# Patient Record
Sex: Female | Born: 1970
Health system: Southern US, Community
[De-identification: ages and names within clinical notes are randomized; demographics above are authoritative.]

## PROBLEM LIST (undated history)

## (undated) DIAGNOSIS — R10819 Abdominal tenderness, unspecified site: Secondary | ICD-10-CM

## (undated) DIAGNOSIS — I10 Essential (primary) hypertension: Secondary | ICD-10-CM

## (undated) DIAGNOSIS — D509 Iron deficiency anemia, unspecified: Secondary | ICD-10-CM

## (undated) DIAGNOSIS — R079 Chest pain, unspecified: Secondary | ICD-10-CM

## (undated) DIAGNOSIS — Z87442 Personal history of urinary calculi: Secondary | ICD-10-CM

## (undated) DIAGNOSIS — N946 Dysmenorrhea, unspecified: Secondary | ICD-10-CM

## (undated) HISTORY — DX: Dysmenorrhea, unspecified: N94.6

## (undated) HISTORY — PX: TOTAL ABDOMINAL HYSTERECTOMY: SHX209

## (undated) HISTORY — DX: Personal history of urinary calculi: Z87.442

## (undated) HISTORY — DX: Iron deficiency anemia, unspecified: D50.9

## (undated) HISTORY — PX: CHOLECYSTECTOMY: SHX55

## (undated) HISTORY — DX: Chest pain, unspecified: R07.9

## (undated) HISTORY — DX: Abdominal tenderness, unspecified site: R10.819

## (undated) HISTORY — DX: Essential (primary) hypertension: I10

---

## 2008-11-19 LAB — CONVERTED CEMR LAB: Pap Smear: NORMAL

## 2010-06-10 ENCOUNTER — Ambulatory Visit: Payer: Self-pay | Admitting: Internal Medicine

## 2010-06-10 ENCOUNTER — Encounter: Payer: Self-pay | Admitting: Internal Medicine

## 2010-06-10 DIAGNOSIS — R079 Chest pain, unspecified: Secondary | ICD-10-CM

## 2010-06-10 DIAGNOSIS — N946 Dysmenorrhea, unspecified: Secondary | ICD-10-CM

## 2010-06-10 DIAGNOSIS — Z87442 Personal history of urinary calculi: Secondary | ICD-10-CM

## 2010-06-10 DIAGNOSIS — I10 Essential (primary) hypertension: Secondary | ICD-10-CM | POA: Insufficient documentation

## 2010-06-10 DIAGNOSIS — R10819 Abdominal tenderness, unspecified site: Secondary | ICD-10-CM

## 2010-06-10 DIAGNOSIS — D509 Iron deficiency anemia, unspecified: Secondary | ICD-10-CM

## 2010-06-10 HISTORY — DX: Personal history of urinary calculi: Z87.442

## 2010-06-10 HISTORY — DX: Chest pain, unspecified: R07.9

## 2010-06-10 HISTORY — DX: Iron deficiency anemia, unspecified: D50.9

## 2010-06-10 HISTORY — DX: Dysmenorrhea, unspecified: N94.6

## 2010-06-10 HISTORY — DX: Essential (primary) hypertension: I10

## 2010-06-10 HISTORY — DX: Abdominal tenderness, unspecified site: R10.819

## 2010-06-11 LAB — CONVERTED CEMR LAB
ALT: 17 units/L (ref 0–35)
AST: 18 units/L (ref 0–37)
Albumin: 3.6 g/dL (ref 3.5–5.2)
Alkaline Phosphatase: 69 units/L (ref 39–117)
Basophils Absolute: 0 10*3/uL (ref 0.0–0.1)
Basophils Relative: 0.6 % (ref 0.0–3.0)
CO2: 27 meq/L (ref 19–32)
GFR calc non Af Amer: 83.21 mL/min (ref >60)
Glucose, Bld: 90 mg/dL (ref 70–99)
HCT: 27.9 % — ABNORMAL LOW (ref 36.0–46.0)
HDL: 49.4 mg/dL (ref >39.00)
Hemoglobin: 9.2 g/dL — ABNORMAL LOW (ref 12.0–15.0)
Leukocytes, UA: NEGATIVE
Lymphs Abs: 1.7 10*3/uL (ref 0.7–4.0)
Monocytes Relative: 13.1 % — ABNORMAL HIGH (ref 3.0–12.0)
Neutro Abs: 2.7 10*3/uL (ref 1.4–7.7)
Nitrite: NEGATIVE
Potassium: 4.4 meq/L (ref 3.5–5.1)
RBC: 4.17 M/uL (ref 3.87–5.11)
RDW: 18.5 % — ABNORMAL HIGH (ref 11.5–14.6)
Saturation Ratios: 4 % — ABNORMAL LOW (ref 20.0–50.0)
Sodium: 140 meq/L (ref 135–145)
Specific Gravity, Urine: 1.02 (ref 1.000–1.030)
TSH: 0.73 microintl units/mL (ref 0.35–5.50)
Total CHOL/HDL Ratio: 3
Total Protein, Urine: NEGATIVE mg/dL
Total Protein: 6.4 g/dL (ref 6.0–8.3)
pH: 6 (ref 5.0–8.0)

## 2010-06-16 ENCOUNTER — Telehealth (INDEPENDENT_AMBULATORY_CARE_PROVIDER_SITE_OTHER): Payer: Self-pay | Admitting: *Deleted

## 2010-06-17 ENCOUNTER — Encounter: Payer: Self-pay | Admitting: Cardiology

## 2010-06-17 ENCOUNTER — Encounter (HOSPITAL_COMMUNITY)
Admission: RE | Admit: 2010-06-17 | Discharge: 2010-08-08 | Payer: Self-pay | Source: Home / Self Care | Attending: Internal Medicine | Admitting: Internal Medicine

## 2010-06-17 ENCOUNTER — Ambulatory Visit: Payer: Self-pay | Admitting: Cardiology

## 2010-06-17 ENCOUNTER — Ambulatory Visit: Payer: Self-pay

## 2010-06-19 ENCOUNTER — Encounter: Payer: Self-pay | Admitting: Internal Medicine

## 2010-07-09 ENCOUNTER — Ambulatory Visit: Payer: Self-pay | Admitting: Internal Medicine

## 2010-07-09 LAB — CONVERTED CEMR LAB
Basophils Absolute: 0.1 10*3/uL (ref 0.0–0.1)
Eosinophils Relative: 2.4 % (ref 0.0–5.0)
HCT: 31.8 % — ABNORMAL LOW (ref 36.0–46.0)
Hemoglobin: 10.7 g/dL — ABNORMAL LOW (ref 12.0–15.0)
Lymphs Abs: 2.3 10*3/uL (ref 0.7–4.0)
MCV: 73.2 fL — ABNORMAL LOW (ref 78.0–100.0)
Monocytes Absolute: 0.6 10*3/uL (ref 0.1–1.0)
Monocytes Relative: 11.2 % (ref 3.0–12.0)
Neutro Abs: 2.7 10*3/uL (ref 1.4–7.7)
Platelets: 355 10*3/uL (ref 150.0–400.0)
RDW: 26.1 % — ABNORMAL HIGH (ref 11.5–14.6)

## 2010-09-08 NOTE — Assessment & Plan Note (Signed)
Summary: Cardiology Nuclear Testing  Nuclear Med Background Indications for Stress Test: Evaluation for Ischemia    History Comments: No documented CAD  Symptoms: Chest Pressure, Chest Pressure with Exertion, Dizziness, DOE, Light-Headedness, Rapid HR  Symptoms Comments: Last episode of CP:2 weeks ago.   Nuclear Pre-Procedure Cardiac Risk Factors: Hypertension, Obesity Caffeine/Decaff Intake: None NPO After: 9:30 PM Lungs: Clear IV 0.9% NS with Angio Cath: 22g     IV Site: R Antecubital IV Started by: Bonnita Levan, RN Chest Size (in) 38     Cup Size B     Height (in): 62 Weight (lb): 226 BMI: 41.49  Nuclear Med Study 1 or 2 day study:  1 day     Stress Test Type:  Stress Reading MD:  Olga Millers, MD     Referring MD:  Oliver Barre, MD Resting Radionuclide:  Technetium 56m Tetrofosmin     Resting Radionuclide Dose:  11 mCi  Stress Radionuclide:  Technetium 30m Tetrofosmin     Stress Radionuclide Dose:  33 mCi   Stress Protocol Exercise Time (min):  5:30 min     Max HR:  160 bpm     Predicted Max HR:  180 bpm  Max Systolic BP: 194 mm Hg     Percent Max HR:  88.89 %     METS: 7.0 Rate Pressure Product:  16109    Stress Test Technologist:  [employer]a [employer], CMA-N     Nuclear Technologist:  Doyne Keel, CNMT  Rest Procedure  Myocardial perfusion imaging was performed at rest 45 minutes following the intravenous administration of Technetium 15m Tetrofosmin.  Stress Procedure  The patient exercised for 5:30.  The patient stopped due to fatigue and denied any chest pain.  There were no diagnostic ST-T wave changes.  Technetium 85m Tetrofosmin was injected at peak exercise and myocardial perfusion imaging was performed after a brief delay.  QPS Raw Data Images:  Acquisition technically good; normal left ventricular size. Stress Images:  There is decreased uptake in the anterior wall. Rest Images:  There is decreased uptake in the anterior wall. Subtraction (SDS):  No evidence of  ischemia. Transient Ischemic Dilatation:  0.94  (Normal <1.22)  Lung/Heart Ratio:  0.22  (Normal <0.45)  Quantitative Gated Spect Images QGS EDV:  96 ml QGS ESV:  34 ml QGS EF:  65 % QGS cine images:  Normal wall motion.   Overall Impression  Exercise Capacity: Fair exercise capacity. BP Response: Normal blood pressure response. Clinical Symptoms: No chest pain ECG Impression: No significant ST segment change suggestive of ischemia. Overall Impression: Normal stress nuclear study with soft tissue attenuation but no ischemia.  Appended Document: Cardiology Nuclear Testing LMOPT - labs negative, normal, or stable  - No Acute problem

## 2010-09-08 NOTE — Progress Notes (Signed)
Summary: nuc pre procedure  Phone Note Outgoing Call Call back at Home Phone [phone]   Call placed by: Cathlyn Parsons RN,  June 16, 2010 4:53 PM Call placed to: Patient Summary of Call: Left message with information on [employer] Information Sheet (see scanned document for details).      Nuclear Med Background Indications for Stress Test: Evaluation for Ischemia     Symptoms: Chest Pain, Dizziness, SOB    Nuclear Pre-Procedure Cardiac Risk Factors: Hypertension Height (in): 62

## 2010-09-08 NOTE — Assessment & Plan Note (Signed)
Summary: NEW/ [employer] /NWS  #   Vital Signs:  Patient profile:   40 year old female Height:      62 inches Weight:      230.50 pounds BMI:     42.31 O2 Sat:      98 % on Room air Temp:     97.6 degrees F oral Pulse rate:   81 / minute BP sitting:   140 / 92  (left arm) Cuff size:   large  Vitals Entered By: Zella Ball Ewing CMA Duncan Dull) (June 10, 2010 9:33 AM)  O2 Flow:  Room air  Preventive Care Screening  Pap Smear:    Date:  11/19/2008    Results:  normal   Mammogram:    Date:  08/09/2006    Results:  normal   CC: New Patient/RE Comments declines flu shot today   CC:  New Patient/RE.  History of Present Illness: here for wellness and f/u ; has some achy type diffuse back pain recurring chronic for several yrs worse to bend and get up from chair, no bowel or bladder change, fever, wt loss, or LE pain/weak/numb.  Pt denies new neuro symptoms such as headache, facial or extremity weakness  No fever, wt loss, night sweats, loss of appetite or other constitutional symptoms  Denies worsening depressive symptoms, suicidal ideation, or panic.  Pt denies polydipsia, polyuria,  Overall good compliance with meds, trying to follow low chol diet, wt stable, little excercise however, and ran out of BP med a few wks ago. Pt states good ability with ADL's, low fall risk, home safety reviewed and adequate, no significant change in hearing or vision, trying to follow lower chol diet, and occasionally active only with regular excercise.    also mentions 6 mo recurrent lower to mid SSCP assoc with SOB and dizziness, dull, worse with excercise, but no n/v, sweats, or palps, syncope, better with rest.   Preventive Screening-Counseling & Management  Alcohol-Tobacco     Smoking Status: never      Drug Use:  no.    Problems Prior to Update: 1)  Menorrhalgia  (ICD-625.3) 2)  Chest Pain  (ICD-786.50) 3)  Abdominal Tenderness  (ICD-789.60) 4)  Preventive Health Care  (ICD-V70.0) 5)  Anemia-iron  Deficiency  (ICD-280.9) 6)  Nephrolithiasis, Hx of  (ICD-V13.01) 7)  Hypertension  (ICD-401.9)  Medications Prior to Update: 1)  None  Current Medications (verified): 1)  Lotrel 10-20 Mg Caps (Amlodipine Besy-Benazepril Hcl) .Marland Kitchen.. 1 By Mouth Once Daily 2)  Slow Fe 160 (50 Fe) Mg Cr-Tabs (Ferrous Sulfate Dried) .Marland Kitchen.. 1po Once Daily 3)  Aspir-Low 81 Mg Tbec (Aspirin) .Marland Kitchen.. 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 parent with stroke, HTN grandparent with DJD, stroke, HTN, DM other family with HTN  Social History: Reviewed history and no changes required. 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[employer]); parttime (health ins  through husband - mixer for [employer]) Never Smoked Alcohol use-yes - social Drug use-no Smoking Status:  never Drug Use:  no  Review of Systems  The patient denies anorexia, fever, vision loss, decreased hearing, hoarseness, syncope, dyspnea on exertion, peripheral edema, prolonged cough, headaches, hemoptysis, abdominal pain, melena, hematochezia, severe indigestion/heartburn, hematuria, muscle weakness, suspicious skin lesions, transient blindness, difficulty walking, depression, unusual weight change, abnormal bleeding, enlarged lymph nodes, and angioedema.          all otherwise negative per pt -    Physical Exam  General:  alert and overweight-appearing.   Head:  normocephalic and atraumatic.   Eyes:  vision grossly intact, pupils equal, and pupils round.   Ears:  R ear normal and L ear normal.   Nose:  no external deformity and no nasal discharge.   Mouth:  no gingival abnormalities and pharynx pink and moist.   Neck:  supple and no masses.   Lungs:  normal respiratory effort and normal breath sounds.   Heart:  normal rate and regular rhythm.   Abdomen:  soft and normal bowel sounds.  and nontender except for mild lower mid abd tender without guarding or rebound Msk:  no joint tenderness and no joint swelling.   Extremities:  no edema, no erythema  Neurologic:  strength normal in all extremities and gait normal.   Skin:  color normal and no rashes.   Psych:  not depressed appearing and slightly anxious.     Impression & Recommendations:  Problem # 1:  Preventive Health Care (ICD-V70.0) Overall doing well, age appropriate education and counseling updated, referral for preventive services and immunizations addressed, dietary counseling and smoking status adressed , most recent labs reviewed, ecg reviewed I have personally reviewed and have noted 1.The patient's medical and social history 2.Their use of alcohol, tobacco or illicit drugs 3.Their current medications and supplements 4. Functional ability including ADL's, fall risk, home safety risk, hearing & visual impairment  5.Diet and physical activities 6.Evidence for depression or mood disorders The patients weight, height, BMI  have been recorded in the chart I have made referrals, counseling and provided education to the patient based review of the above  Orders: EKG w/ Interpretation (93000) TLB-BMP (Basic Metabolic Panel-BMET) (80048-METABOL) TLB-CBC Platelet - w/Differential (85025-CBCD) TLB-Hepatic/Liver Function Pnl (80076-HEPATIC) TLB-TSH (Thyroid Stimulating Hormone)  (84443-TSH) TLB-Lipid Panel (80061-LIPID)  Problem # 2:  HYPERTENSION (ICD-401.9)  Her updated medication list for this problem includes:    Lotrel 10-20 Mg Caps (Amlodipine besy-benazepril hcl) .Marland Kitchen... 1 by mouth once daily uncontrolled, to re-start med. f/u BP at home and next visit  BP today: 140/92  Problem # 3:  ANEMIA-IRON DEFICIENCY (ICD-280.9)  Her updated medication list for this problem includes:    Slow Fe 160 (50 Fe) Mg Cr-tabs (Ferrous sulfate dried) .Marland Kitchen... 1po once daily  Orders: TLB-IBC Pnl (Iron/FE;Transferrin) (83550-IBC) to cont daily iron supp, check iron level, to f/u with GYN for menorraghia as well   Problem # 4:  ABDOMINAL TENDERNESS (ICD-789.60) low mid abd  - incidently noted this - for urine study as well  Orders: TLB-Udip w/ Micro (81001-URINE)  Problem # 5:  CHEST PAIN (ICD-786.50)  very young for CV dz, but has hx of HTN , ? elev chol and strong FH  of CV problem, with hx suspicious for cardiac - for stress test; ecg reveiwed today, also for cxr   Complete Medication List: 1)  Lotrel 10-20 Mg Caps (Amlodipine besy-benazepril hcl) .Marland Kitchen.. 1 by mouth  once daily 2)  Slow Fe 160 (50 Fe) Mg Cr-tabs (Ferrous sulfate dried) .Marland Kitchen.. 1po once daily 3)  Aspir-low 81 Mg Tbec (Aspirin) .Marland Kitchen.. 1po once daily  Other Orders: Tdap => 35yrs IM (10272) Admin 1st Vaccine (53664) Cardiolite (Cardiolite) T-2 View CXR, Same Day (71020.5TC)  Patient Instructions: 1)  please call for your yearly mammogram (consider calling [facility] on [address], or [facility] on [address]), as well as the Pap smear with GYN 2)  Please go to the Lab in the basement for your blood and/or urine tests today 3)  Please call the number on the [facility] for results of your testing 4)  You are given the refill today 5)  Take an Aspirin every day - 81 mg per day - COATED only (OTC) if you can see the GYN to make sure no worseing bleeding 6)  you had the tetanus shot today 7)  You will be contacted  about the referral(s) to: stress test 8)  Please schedule a follow-up appointment in 1 yr , or sooner if needed 9)  Check your Blood Pressure regularly. If it is above 140/90: you should make an appointment sooner Prescriptions: LOTREL 10-20 MG CAPS (AMLODIPINE BESY-BENAZEPRIL HCL) 1 by mouth once daily  #90 x 3   Entered and Authorized by:   Corwin Levins MD   Signed by:   Corwin Levins MD on 06/10/2010   Method used:   Print then Give to Patient   RxID:   [id number]    Orders Added: 1)  EKG w/ Interpretation [93000] 2)  Tdap => 38yrs IM [90715] 3)  Admin 1st Vaccine [90471] 4)  Cardiolite [Cardiolite] 5)  T-2 View CXR, Same Day [71020.5TC] 6)  TLB-BMP (Basic Metabolic Panel-BMET) [80048-METABOL] 7)  TLB-CBC Platelet - w/Differential [85025-CBCD] 8)  TLB-Hepatic/Liver Function Pnl [80076-HEPATIC] 9)  TLB-TSH (Thyroid Stimulating Hormone) [84443-TSH] 10)  TLB-Lipid Panel [80061-LIPID] 11)  TLB-Udip w/ Micro [81001-URINE] 12)  TLB-IBC Pnl (Iron/FE;Transferrin) [83550-IBC] 13)  New Patient 40-64 years [99386]   Immunizations Administered:  Tetanus Vaccine:    Vaccine Type: Tdap    Site: left deltoid    Mfr: GlaxoSmithKline    Dose: 0.5 ml    Route: IM    Given by: Zella Ball Ewing CMA (AAMA)    Exp. Date: 05/28/2012    Lot #: [id number]    VIS given: 06/26/08 version given June 10, 2010.   Immunizations Administered:  Tetanus Vaccine:    Vaccine Type: Tdap    Site: left deltoid    Mfr: GlaxoSmithKline    Dose: 0.5 ml    Route: IM    Given by: Zella Ball Ewing CMA (AAMA)    Exp. Date: 05/28/2012    Lot #: [id number]    VIS given: 06/26/08 version given June 10, 2010.

## 2010-09-08 NOTE — Miscellaneous (Signed)
Summary: Orders Update  Clinical Lists Changes  Problems: Added new problem of MENORRHALGIA (ICD-625.3) Orders: Added new Referral order of Gynecologic Referral (Gyn) - Signed

## 2010-09-08 NOTE — Consult Note (Signed)
[facility]   Imported By: Lester Dwight 07/03/2010 10:44:32  _____________________________________________________________________  External Attachment:    Type:   Image     Comment:   External Document

## 2010-09-28 ENCOUNTER — Encounter (HOSPITAL_COMMUNITY)
Admission: RE | Admit: 2010-09-28 | Discharge: 2010-09-28 | Disposition: A | Discharge status: Home / Self Care | Payer: Managed Care, Other (non HMO) | Source: Ambulatory Visit | Attending: Obstetrics & Gynecology | Admitting: Obstetrics & Gynecology

## 2010-09-28 DIAGNOSIS — Z01812 Encounter for preprocedural laboratory examination: Secondary | ICD-10-CM | POA: Insufficient documentation

## 2010-09-28 LAB — CBC
HCT: 35 % — ABNORMAL LOW (ref 36.0–46.0)
MCHC: 31.7 g/dL (ref 30.0–36.0)
MCV: 79 fL (ref 78.0–100.0)
Platelets: 240 10*3/uL (ref 150–400)
RDW: 15.3 % (ref 11.5–15.5)
WBC: 5.4 10*3/uL (ref 4.0–10.5)

## 2010-10-04 ENCOUNTER — Other Ambulatory Visit: Payer: Self-pay | Admitting: Obstetrics & Gynecology

## 2010-10-05 ENCOUNTER — Ambulatory Visit (HOSPITAL_COMMUNITY)
Admission: RE | Admit: 2010-10-05 | Discharge: 2010-10-06 | Disposition: A | Discharge status: Home / Self Care | Payer: Managed Care, Other (non HMO) | Source: Ambulatory Visit | Attending: Obstetrics & Gynecology | Admitting: Obstetrics & Gynecology

## 2010-10-05 DIAGNOSIS — N8000 Endometriosis of the uterus, unspecified: Secondary | ICD-10-CM | POA: Insufficient documentation

## 2010-10-05 DIAGNOSIS — D250 Submucous leiomyoma of uterus: Secondary | ICD-10-CM | POA: Insufficient documentation

## 2010-10-05 DIAGNOSIS — N920 Excessive and frequent menstruation with regular cycle: Secondary | ICD-10-CM | POA: Insufficient documentation

## 2010-10-05 DIAGNOSIS — I10 Essential (primary) hypertension: Secondary | ICD-10-CM | POA: Insufficient documentation

## 2010-10-05 DIAGNOSIS — D252 Subserosal leiomyoma of uterus: Secondary | ICD-10-CM | POA: Insufficient documentation

## 2010-10-05 DIAGNOSIS — D251 Intramural leiomyoma of uterus: Secondary | ICD-10-CM | POA: Insufficient documentation

## 2010-10-06 LAB — CBC
HCT: 34.4 % — ABNORMAL LOW (ref 36.0–46.0)
MCV: 79.1 fL (ref 78.0–100.0)
Platelets: 272 10*3/uL (ref 150–400)
RBC: 4.35 MIL/uL (ref 3.87–5.11)
WBC: 11.2 10*3/uL — ABNORMAL HIGH (ref 4.0–10.5)

## 2010-10-27 NOTE — H&P (Signed)
[name], [name]                ACCOUNT NO.:  [id number]  MEDICAL RECORD NO.:  [id number]           PATIENT TYPE:  LOCATION:                                 FACILITY:  PHYSICIAN:  [name], M.D.   DATE OF BIRTH:  Dec 15, 1970  DATE OF ADMISSION: DATE OF DISCHARGE:                             HISTORY & PHYSICAL   ADMISSION DIAGNOSES: 1. Dysfunctional uterine bleeding. 2. Menorrhagia. 3. Probable adenomyosis by ultrasound findings. 4. Request for definitive surgical intervention.  The patient is a 40 year old black married female, gravida 1, para 1, delivered by cesarean section, who has had protracted history of dysfunctional uterine bleeding who is a regular patient of Dr. Teodora Oneal.  His workup included pelvic ultrasound and endometrial biopsy with findings as noted above.  The histology on the biopsy material was benign. The patient was referred to me for consultation regarding probable surgery.  Other options discussed included an intrauterine device and an endometrial ablation.  The ablation is known to have a higher failure rate in those with adenomyosis.  The patient prefers not to have an intrauterine device.  She has requested definitive surgical intervention, is admitted now for laparoscopically assisted vaginal hysterectomy.  Her current review of systems is otherwise negative.  She has no cardiopulmonary, GI, or GU complaints.  PAST MEDICAL HISTORY:  The patient is known to be hypertensive.  She takes Lotrel 10/20 on a daily basis.  Her regular medical physician is Dr. Oliver Oneal.  Her only other medication is Slow Iron twice daily, and she has taken Provera intermittently as a way of controlling her bleeding.  She does not smoke cigarettes or use alcohol.  She has no known allergies to medications.  The only other surgical procedure was a cholecystectomy in 2008.  FAMILY HISTORY:  Noncontributory.  PHYSICAL EXAMINATION:  HEENT:  Grossly within  normal limits. VITAL SIGNS:  Blood pressure in my office was 146/88.  The patient's weight is 232 pounds, height 5 feet 3/4 inches. NECK:  Her thyroid gland is not palpably enlarged to my examination. CHEST:  Clear to auscultation throughout. HEART:  Normal sinus rhythm without murmurs, rubs, or gallops. ABDOMEN:  Obese but soft and nontender without appreciable organomegaly or palpable masses.  Scars are present consistent with her previous surgery. PELVIC:  The external genitalia, vagina, and cervix are normal.  The bimanual is compromised by body habitus, but the uterus is thought to be slightly enlarged by ultrasound.  No ovarian masses were noted. EXTREMITIES:  Without cyanosis, clubbing, or edema.  ASSESSMENT:  Menometrorrhagia, previously benign endometrial biopsy in November 2011, persistent dysfunctional bleeding even with cyclic hormonal withdrawal.  PLAN:  Laparoscopically-assisted vaginal hysterectomy, bilateral salpingo-oophorectomy only in the event of ovarian abnormalities or evidence of ovarian disease.  The patient has reviewed ACOG materials regarding this procedure which includes risk of injury to other organs, hemorrhage intraoperatively and postoperatively, intraoperative infection, and deep vein thrombosis.  Appropriate prophylactic measures have also been discussed including serial compression devices to the lower extremities and IV antibiotics and early ambulation.  The patient is prepared to proceed with this procedure and  verbally accepts the risk associated with it.     [name], M.D.     WRN/MEDQ  D:  10/03/2010  T:  10/04/2010  Job:  [id number]  Electronically Signed by [name] M.D. on 10/27/2010 07:44:44 AM

## 2010-10-27 NOTE — Discharge Summary (Signed)
  NAMECUMA, [name]                ACCOUNT NO.:  [id number]  MEDICAL RECORD NO.:  [id number]           PATIENT TYPE:  O  LOCATION:  9302                          FACILITY:  [facility]  PHYSICIAN:  Freddy Finner, M.D.   DATE OF BIRTH:  1971-07-26  DATE OF ADMISSION:  10/05/2010 DATE OF DISCHARGE:  10/06/2010                              DISCHARGE SUMMARY   DISCHARGE DIAGNOSES:  Uterine adenomyosis is yet unconfirmed histologically, clinical diagnosis of dysfunctional uterine bleeding, menorrhagia.  OPERATIVE PROCEDURE:  Laparoscopic-assisted vaginal hysterectomy.  Intraoperative and postoperative complications none.  DISPOSITION:  The patient is in satisfactory improved condition on the morning of the first postoperative day.  She is ambulating without assistance.  She is having adequate bowel and bladder function.  She is tolerating liquids orally, she is instructed to call for fever, for heavy bleeding.  She is to avoid heavy lifting.  She is to avoid vaginal entry.  She is to return to the office in 2 weeks for postoperative followup.  She is to take a regular diet.  She is to resume her preoperative medications.  Details of the present history and physical exam, past history, review of systems were all recorded in the admission note.  Briefly, the patient has had a persistent dysfunctional uterine bleeding and has ultrasound findings consistent with adenomyosis.  Other options were discussed and the patient was elected to proceed with definitive surgical intervention.  She is a regular patient of Dr. Chevis Pretty, who kindly referred her to me.  Her physical exam findings on admission are remarkable for her obesity which is moderate and for enlargement of the uterus which is tender  Laboratory data during this admission includes postoperative CBC with a hemoglobin of 11.1 and a white count of 11.2.  Her admission labs included a negative MRSA screen, normal CBC except for hemoglobin  11.1 and normal, looks like she has all the labs that was done.  HOSPITAL COURSE:  The patient was admitted on the morning of surgery. She was treated perioperatively with an IV antibiotic and serial compression devices for lower extremities which were maintained for 24 hours postop.  Her surgical procedure was accomplished without difficulty or significant bleeding.  By the morning of the first postoperative day, her condition was considered to be good.  She remained afebrile throughout from the disposition as noted above.     Freddy Finner, M.D.    WRN/MEDQ  D:  10/06/2010  T:  10/06/2010  Job:  [id number]  Electronically Signed by Lacretia Nicks. Souleymane Saiki M.D. on 10/27/2010 07:44:41 AM

## 2010-10-27 NOTE — Op Note (Signed)
[name], [name]                ACCOUNT NO.:  [id number]  MEDICAL RECORD NO.:  [id number]           PATIENT TYPE:  O  LOCATION:  9302                          FACILITY:  [facility]  PHYSICIAN:  Freddy Finner, M.D.   DATE OF BIRTH:  22-Jul-1971  DATE OF PROCEDURE:  10/05/2010 DATE OF DISCHARGE:                              OPERATIVE REPORT   PREOPERATIVE DIAGNOSIS:  Persistent dysfunctional uterine bleeding. Ultrasound findings consistent with adenomyosis.  The patient requests for definitive surgical intervention with suspected __________.  OPERATIVE PROCEDURE:  Laparoscopic-assisted vaginal hysterectomy.  SURGEON:  Freddy Finner, MD  ASSISTANT:  Rana Snare.  ANESTHESIA:  General endotracheal.  ESTIMATED INTRAOPERATIVE BLOOD LOSS:  200 mL.  INTRAOPERATIVE COMPLICATIONS:  None.  The patient is a 40 year old with a long history of dysfunctional uterine bleeding, unrelieved by __________ therapy and an ultrasound findings in the office consistent with adenomyosis.  Other options have been discussed with her for treatment including an intrauterine device and an endometrial ablation, which is thought to have a higher failure rate with adenomyosis.  The patient has requested definitive surgery or resolve the issue and has requested hysterectomy and is admitted at this time for that purpose.  She was admitted on the morning of surgery.  She had revealed the ACOG materials including potential risks of the procedure __________ prophylaxis measures were discussed with her and she is now admitted to proceed with surgery.  She was admitted on the morning of surgery, placed on PAS hose, given a gram of Ancef IV preoperatively, brought to the operating room, there placed under adequate general endotracheal anesthesia.  She was placed in dorsal lithotomy position using the [employer] system.  Betadine prep of abdomen, perineum and vagina was carried out in the usual fashion.  Bladder was  evacuated using sterile technique.  Cervix was visualized with a bivalve speculum and Hulka tenaculum attached. Sterile drapes were applied.  Two small incisions were made, one at the umbilicus, one just above the symphysis.  Through the upper incision, disposable Veress needle was introduced without difficulty.  A pneumoperitoneum of approximately 1.8 L was allowed to accumulate with 11-mm blade, disposable trocar was then introduced through the umbilical incision while elevating the anterior abdominal wall manually.  Direct inspection revealed adequate placement of the instrument with no evidence of injury on entry.  Pneumoperitoneum was allowed to accumulate with carbon dioxide gas.  Under direct visualization, a second 5-mm probe was placed.  __________ irrigation system were used.  The upper abdomen exploration was compromised by adhesions consistent with previous cholecystectomy.  The appendix was not visualized.  Pelvic organs were visualized.  The uterus was symmetrically enlarged.  The tubes and ovaries were found to be normal and at her request will be left.  Using the [employer] device through the operating channel, the laparoscope and a blunt probe to the lower trocar sleeve for traction, the utero-ovarian, fallopian tube, crown ligament and upper broad ligament were progressively sealed and divided.  The dissection was carried down to approximately the level of the uterine arteries.  There were some scarring along the round ligament  on the right side, which did not create a significant problem.  The left side was treated essentially identically, although no additional adhesions were noted.  Attention was then turned to the vaginal portion of the procedure.  Gas was allowed to escape from the abdomen.  The instruments removed, trocars left in place.  Posterior weighted vaginal retractor was placed.  __________ retractors were used for exposure of the cervix.  Hulka tenaculum  was removed and replaced with a Jacobs tenaculum.  With an Allis, to tent the mucosa posterior to the cervix.  The cul-de-sac was entered with Mayo scissors.  Cervix was circumscribed with a scalpel to release the mucosa.  Uterosacral pedicles on each side were sealed and with the LigaSure system and sharply divided.  Bladder pillars were similarly treated.  Very careful dissection was used to dissect the bladder off the cervix and lower uterine segment because of the history of cesarean delivery.  Please note immediately prior to this dissection, the patient was given IV indigo carmine to allow immediate identification if any bladder injury occurred and none did.  The anterior peritoneum was entered.  The LigaSure device was used to seal and divide the uterine artery pedicles and the pedicle on either side above the uterine arteries.  This allowed delivery of the uterus through the introitus using a Jacobs tenaculum and a thyroid clamp.  Uterus weighed 240 grams. Angles of the vagina were grasped on either side.  Moist tape was used to pack the intestinal contents out of the pelvis.  __________ posterior weighted retractor was then used.  Angles of the vagina were anchored to uterosacrals with mattress sutures of 0 Monocryl.  The uterosacrals were plicated and posterior peritoneum closed with an interrupted 0 Monocryl. Cuff was closed vertically with figure-of-eights of 0 Monocryl.  Foley catheter was placed, immediate spillage of blue dye was noted. Reinspection laparoscopically was then carried out using __________ irrigation system.  Photographs were made throughout the procedure and were retained in the office record.  Small amount of bloody material was aspirated from the abdomen along with the irrigating solution used to cleanse and inspect the pedicles.  Hemostasis was complete.  All pack, needle and instrument counts were correct.  Gas was allowed to escape from the abdomen.   Instruments removed.  The incisions were anesthetized with 0.25% plain Marcaine.  Steri-Strips were applied to the lower incision.  The upper incision was closed with subcuticular Dexon sutures and anesthetized with 0.25% Marcaine and a sterile dressing applied. The patient was awakened, taken to recovery in good condition.     Freddy Finner, M.D.     WRN/MEDQ  D:  10/05/2010  T:  10/06/2010  Job:  [id number]  Electronically Signed by W. NEAL M.D. on 10/27/2010 07:44:47 AM

## 2010-11-12 ENCOUNTER — Ambulatory Visit (INDEPENDENT_AMBULATORY_CARE_PROVIDER_SITE_OTHER): Payer: Managed Care, Other (non HMO) | Admitting: Internal Medicine

## 2010-11-12 ENCOUNTER — Encounter: Payer: Self-pay | Admitting: Internal Medicine

## 2010-11-12 DIAGNOSIS — Z0000 Encounter for general adult medical examination without abnormal findings: Secondary | ICD-10-CM

## 2010-11-12 DIAGNOSIS — I10 Essential (primary) hypertension: Secondary | ICD-10-CM

## 2010-11-12 DIAGNOSIS — Z0001 Encounter for general adult medical examination with abnormal findings: Secondary | ICD-10-CM | POA: Insufficient documentation

## 2010-11-12 NOTE — Patient Instructions (Signed)
Continue all other medications as before Please return in Nov 2012 with Lab testing done 3-5 days before

## 2010-11-12 NOTE — Progress Notes (Signed)
  Subjective:    Patient ID: [name], female    DOB: [birth year], 40 y.o.   MRN: [id number]  HPI  Here to f/u at the urging of GYN to f/u post TAH;  Pt states overall doing well, without specific complaint.  Pt denies chest pain, increased sob or doe, wheezing, orthopnea, PND, increased LE swelling, palpitations, dizziness or syncope. Pt denies new neurological symptoms such as new headache, or facial or extremity weakness or numbness  Pt denies polydipsia, polyuria  Pt states overall good compliance with meds, trying to follow lower cholesterol diet, wt overall stable but little exercise however.    Pt denies fever, wt loss, night sweats, loss of appetite, or other constitutional symptoms Denies worsening depressive symptoms, suicidal ideation, or panic.  No other new complaints.  BP at home usually better controlled than today - has not taken BP med today  Past Medical History  Diagnosis Date  . ANEMIA-IRON DEFICIENCY 06/10/2010  . HYPERTENSION 06/10/2010  . MENORRHALGIA 06/10/2010  . CHEST PAIN 06/10/2010  . ABDOMINAL TENDERNESS 06/10/2010  . NEPHROLITHIASIS, HX OF 06/10/2010   Past Surgical History  Procedure Date  . Cholecystectomy   . Total abdominal hysterectomy     reports that she has never smoked. She does not have any smokeless tobacco history on file. She reports that she drinks alcohol. She reports that she does not use illicit drugs. family history includes Diabetes in her other; Hypertension in her others; and Stroke in her others. No Known Allergies Current Outpatient Prescriptions on File Prior to Visit  Medication Sig Dispense Refill  . amLODipine-benazepril (LOTREL) 10-20 MG per capsule Take 1 capsule by mouth daily.        Marland Kitchen aspirin 81 MG EC tablet Take 81 mg by mouth daily.        . ferrous sulfate (SLOW FE) 160 (50 FE) MG TBCR Take by mouth daily.         Review of Systems All otherwise neg per pt     Objective:   Physical ExamBP 132/90  Pulse 78  Temp(Src)  98.1 F (36.7 C) (Oral)  Ht 5\' 1"  (1.549 m)  Wt 232 lb 6 oz (105.405 kg)  BMI 43.91 kg/m2  SpO2 98% Physical Exam  VS noted Constitutional: Pt appears well-developed and well-nourished.  HENT: Head: Normocephalic.  Right Ear: External ear normal.  Left Ear: External ear normal.  Eyes: Conjunctivae and EOM are normal. Pupils are equal, round, and reactive to light.  Neck: Normal range of motion. Neck supple.  Cardiovascular: Normal rate and regular rhythm.   Pulmonary/Chest: Effort normal and breath sounds normal.  Abd:  Soft, NT, non-distended, + BS Neurological: Pt is alert. No cranial nerve deficit.  Skin: Skin is warm. No erythema.  Psychiatric: Pt behavior is normal. Thought content normal.          Assessment & Plan:

## 2010-11-12 NOTE — Assessment & Plan Note (Signed)
stable overall by hx and exam, most recent lab reviewed with pt, and pt to continue medical treatment as before 

## 2011-03-10 ENCOUNTER — Encounter: Payer: Self-pay | Admitting: Internal Medicine

## 2011-03-10 ENCOUNTER — Ambulatory Visit (INDEPENDENT_AMBULATORY_CARE_PROVIDER_SITE_OTHER): Payer: Managed Care, Other (non HMO) | Admitting: Internal Medicine

## 2011-03-10 VITALS — BP 132/78 | HR 69 | Temp 97.2°F | Ht 62.0 in | Wt 236.5 lb

## 2011-03-10 DIAGNOSIS — I10 Essential (primary) hypertension: Secondary | ICD-10-CM

## 2011-03-10 DIAGNOSIS — Z0000 Encounter for general adult medical examination without abnormal findings: Secondary | ICD-10-CM

## 2011-03-10 NOTE — Progress Notes (Signed)
  Subjective:    Patient ID: [name], female    DOB: [birth year], 40 y.o.   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  Pt denies new neurological symptoms such as new headache, or facial or extremity weakness or numbness   Pt denies polydipsia, polyuria, or low sugar symptoms such as weakness or confusion improved with po intake.  Pt states overall good compliance with meds, trying to follow lower cholesterol diet, wt overall stable but little exercise however. Past Medical History  Diagnosis Date  . ANEMIA-IRON DEFICIENCY 06/10/2010  . HYPERTENSION 06/10/2010  . MENORRHALGIA 06/10/2010  . CHEST PAIN 06/10/2010  . ABDOMINAL TENDERNESS 06/10/2010  . NEPHROLITHIASIS, HX OF 06/10/2010   Past Surgical History  Procedure Date  . Cholecystectomy   . Total abdominal hysterectomy     reports that she has never smoked. She does not have any smokeless tobacco history on file. She reports that she drinks alcohol. She reports that she does not use illicit drugs. family history includes Diabetes in her other; Hypertension in her others; and Stroke in her others. No Known Allergies Current Outpatient Prescriptions on File Prior to Visit  Medication Sig Dispense Refill  . amLODipine-benazepril (LOTREL) 10-20 MG per capsule Take 1 capsule by mouth daily.        Marland Kitchen aspirin 81 MG EC tablet Take 81 mg by mouth daily.        . ferrous sulfate (SLOW FE) 160 (50 FE) MG TBCR Take by mouth daily.         Review of Systems Review of Systems  Constitutional: Negative for diaphoresis and unexpected weight change.  HENT: Negative for drooling and tinnitus.   Eyes: Negative for photophobia and visual disturbance.  Respiratory: Negative for choking and stridor.      Objective:   Physical Exam BP 132/78  Pulse 69   Temp(Src) 97.2 F (36.2 C) (Oral)  Ht 5\' 2"  (1.575 m)  Wt 236 lb 8 oz (107.276 kg)  BMI 43.26 kg/m2  SpO2 99% Physical Exam  VS noted Constitutional: Pt appears well-developed and well-nourished.  HENT: Head: Normocephalic.  Right Ear: External ear normal.  Left Ear: External ear normal.  Eyes: Conjunctivae and EOM are normal. Pupils are equal, round, and reactive to light.  Neck: Normal range of motion. Neck supple.  Cardiovascular: Normal rate and regular rhythm.   Pulmonary/Chest: Effort normal and breath sounds normal.  Abd:  Soft, NT, non-distended, + BS Neurological: Pt is alert. No cranial nerve deficit.  Skin: Skin is warm. No erythema.  Psychiatric: Pt behavior is normal. Thought content normal.        Assessment & Plan:

## 2011-03-10 NOTE — Patient Instructions (Addendum)
Your Blood Pressure was found to be controlled today, and refill done to the pharmacy You are given the work note today Continue all other medications as before Please return in April 2013 for your yearly visit, or sooner if needed, with Lab testing done 3-5 days before (ok to cancel Nov 2012 appt)

## 2011-04-05 ENCOUNTER — Ambulatory Visit (INDEPENDENT_AMBULATORY_CARE_PROVIDER_SITE_OTHER): Payer: Managed Care, Other (non HMO)

## 2011-04-05 VITALS — BP 132/82

## 2011-04-05 DIAGNOSIS — I10 Essential (primary) hypertension: Secondary | ICD-10-CM

## 2011-04-07 ENCOUNTER — Ambulatory Visit (INDEPENDENT_AMBULATORY_CARE_PROVIDER_SITE_OTHER): Payer: Managed Care, Other (non HMO) | Admitting: *Deleted

## 2011-04-07 VITALS — BP 138/88

## 2011-04-07 DIAGNOSIS — I10 Essential (primary) hypertension: Secondary | ICD-10-CM

## 2011-04-09 ENCOUNTER — Ambulatory Visit (INDEPENDENT_AMBULATORY_CARE_PROVIDER_SITE_OTHER): Payer: Managed Care, Other (non HMO)

## 2011-04-09 DIAGNOSIS — I10 Essential (primary) hypertension: Secondary | ICD-10-CM

## 2011-04-22 NOTE — Progress Notes (Signed)
  Subjective:    Patient ID: [name], female    DOB: 1971/02/18, 40 y.o.   MRN: [id number]  HPI    Review of Systems     Objective:   Physical Exam        Assessment & Plan:

## 2011-04-22 NOTE — Assessment & Plan Note (Signed)
stable overall by hx and exam,and pt to continue medical treatment as before, VS today reviewed and agree

## 2011-06-17 ENCOUNTER — Telehealth: Payer: Self-pay

## 2011-06-17 NOTE — Telephone Encounter (Signed)
Ok for note  See encounters tab (under chart review)  -  The Preventative Encounter is emr talk for physical

## 2011-06-17 NOTE — Telephone Encounter (Signed)
Patient needs a letter stating she has been seen (physical ?)  in the past year for insurance reasons, please advise as patient has been seen several times but do not see an actual CPX appointment.

## 2011-06-18 NOTE — Telephone Encounter (Signed)
Completed requested letter, called and informed the patient to pickup at front desk at her convenience.

## 2011-06-21 ENCOUNTER — Ambulatory Visit: Payer: Managed Care, Other (non HMO) | Admitting: Internal Medicine

## 2011-06-29 ENCOUNTER — Other Ambulatory Visit: Payer: Self-pay | Admitting: Internal Medicine

## 2011-10-09 ENCOUNTER — Ambulatory Visit (INDEPENDENT_AMBULATORY_CARE_PROVIDER_SITE_OTHER): Payer: Managed Care, Other (non HMO) | Admitting: Family Medicine

## 2011-10-09 ENCOUNTER — Encounter: Payer: Self-pay | Admitting: Family Medicine

## 2011-10-09 VITALS — BP 160/98 | HR 78 | Temp 97.6°F | Wt 238.0 lb

## 2011-10-09 DIAGNOSIS — J019 Acute sinusitis, unspecified: Secondary | ICD-10-CM

## 2011-10-09 MED ORDER — FLUTICASONE PROPIONATE 50 MCG/ACT NA SUSP: 2.0000 | Freq: Every day | NASAL | Status: DC

## 2011-10-09 MED ORDER — AMOXICILLIN-POT CLAVULANATE 875-125 MG PO TABS: 1.0000 | ORAL_TABLET | Freq: Two times a day (BID) | ORAL | Status: AC

## 2011-10-09 NOTE — Progress Notes (Signed)
  Subjective:    Patient ID: [name], female    DOB: 06/06/1971, 41 y.o.   MRN: [id number]  HPI CC: cold sxs  BP elevated today but didn't take lotrel this morning.  Will take when she goes home.  Cold sxs for last week.  ST initially and fever none recently, RN, watery eyes, stuffy nose, coughing.  Cough keeping her up at night, some production of yellow mucous.  Mild PNDrainage.  So far has tried nyquil but not helping.  no fevers/chills, ear pain or tooth pain, abd pain, n/v, rashes.  No HA.  + sick contacts at work.  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  No smoker at home.  No h/o asthma.  Review of Systems Per HPI    Objective:   Physical Exam  Vitals reviewed. Constitutional: She appears well-developed and well-nourished. No distress.  HENT:  Head: Normocephalic and atraumatic.  Right Ear: Hearing, tympanic membrane, external ear and ear canal normal.  Left Ear: Hearing, tympanic membrane, external ear and ear canal normal.  Nose: Nose normal. No mucosal edema or rhinorrhea. Right sinus exhibits no maxillary sinus tenderness and no frontal sinus tenderness. Left sinus exhibits no maxillary sinus tenderness and no frontal sinus tenderness.  Mouth/Throat: Uvula is midline, oropharynx is clear and moist and mucous membranes are normal. No oropharyngeal exudate, posterior oropharyngeal edema, posterior oropharyngeal erythema or tonsillar abscesses.  Eyes: Conjunctivae and EOM are normal. Pupils are equal, round, and reactive to light. No scleral icterus.  Neck: Normal range of motion. Neck supple.  Cardiovascular: Normal rate, regular rhythm, normal heart sounds and intact distal pulses.   No murmur heard. Pulmonary/Chest: Effort normal and breath sounds normal. No respiratory distress. She has no wheezes. She has no rales.  Lymphadenopathy:    She has no cervical adenopathy.  Skin: Skin is warm and dry. No rash noted.       Assessment & Plan:

## 2011-10-09 NOTE — Assessment & Plan Note (Signed)
Anticipate viral. WASP in case not improving. Supportive care as per instructions. Red flags to return discussed.

## 2011-10-09 NOTE — Patient Instructions (Signed)
You have a sinus infection, but likely viral. Try nasal spray for congestion. Hold onto augmentin prescription if not improving. Or symptoms worsening or going on past 10 days. Push fluids and plenty of rest. Nasal saline irrigation or neti pot to help drain sinuses. May use simple mucinex (not decongestant) with plenty of fluid to help mobilize mucous. Let us know if fever >101.5, trouble opening/closing mouth, difficulty swallowing, or worsening - you may need to be seen again.

## 2011-11-16 ENCOUNTER — Encounter: Payer: Managed Care, Other (non HMO) | Admitting: Internal Medicine

## 2011-12-31 ENCOUNTER — Other Ambulatory Visit (INDEPENDENT_AMBULATORY_CARE_PROVIDER_SITE_OTHER): Payer: Managed Care, Other (non HMO)

## 2011-12-31 DIAGNOSIS — Z0000 Encounter for general adult medical examination without abnormal findings: Secondary | ICD-10-CM

## 2011-12-31 LAB — CBC WITH DIFFERENTIAL/PLATELET
Basophils Absolute: 0.1 10*3/uL (ref 0.0–0.1)
Eosinophils Absolute: 0.1 10*3/uL (ref 0.0–0.7)
Lymphocytes Relative: 37 % (ref 12.0–46.0)
MCHC: 33.8 g/dL (ref 30.0–36.0)
Monocytes Absolute: 0.5 10*3/uL (ref 0.1–1.0)
Neutrophils Relative %: 48.2 % (ref 43.0–77.0)
Platelets: 243 10*3/uL (ref 150.0–400.0)
RBC: 4.46 Mil/uL (ref 3.87–5.11)
RDW: 13.3 % (ref 11.5–14.6)

## 2011-12-31 LAB — URINALYSIS, ROUTINE W REFLEX MICROSCOPIC
Nitrite: NEGATIVE
Specific Gravity, Urine: 1.01 (ref 1.000–1.030)
Urine Glucose: NEGATIVE
Urobilinogen, UA: 0.2 (ref 0.0–1.0)

## 2011-12-31 LAB — BASIC METABOLIC PANEL
BUN: 8 mg/dL (ref 6–23)
CO2: 27 mEq/L (ref 19–32)
Chloride: 107 mEq/L (ref 96–112)
Creatinine, Ser: 0.9 mg/dL (ref 0.4–1.2)
Potassium: 4.1 mEq/L (ref 3.5–5.1)

## 2011-12-31 LAB — HEPATIC FUNCTION PANEL
Alkaline Phosphatase: 58 U/L (ref 39–117)
Bilirubin, Direct: 0.1 mg/dL (ref 0.0–0.3)
Total Bilirubin: 0.5 mg/dL (ref 0.3–1.2)

## 2011-12-31 LAB — LIPID PANEL
LDL Cholesterol: 95 mg/dL (ref 0–99)
Total CHOL/HDL Ratio: 3
VLDL: 18.8 mg/dL (ref 0.0–40.0)

## 2012-01-06 ENCOUNTER — Encounter: Payer: Self-pay | Admitting: Internal Medicine

## 2012-01-06 ENCOUNTER — Ambulatory Visit (INDEPENDENT_AMBULATORY_CARE_PROVIDER_SITE_OTHER): Payer: Managed Care, Other (non HMO) | Admitting: Internal Medicine

## 2012-01-06 VITALS — BP 130/92 | HR 73 | Temp 97.5°F | Ht 62.0 in | Wt 241.1 lb

## 2012-01-06 DIAGNOSIS — J069 Acute upper respiratory infection, unspecified: Secondary | ICD-10-CM | POA: Insufficient documentation

## 2012-01-06 DIAGNOSIS — I10 Essential (primary) hypertension: Secondary | ICD-10-CM

## 2012-01-06 DIAGNOSIS — Z0000 Encounter for general adult medical examination without abnormal findings: Secondary | ICD-10-CM

## 2012-01-06 NOTE — Assessment & Plan Note (Signed)

## 2012-01-06 NOTE — Assessment & Plan Note (Signed)
Mild, likely viral, for tylenol prn

## 2012-01-06 NOTE — Assessment & Plan Note (Signed)
Mild elve likely situational, The current medical regimen is overall effective;  continue present plan and medications. BP Readings from Last 3 Encounters:  01/06/12 130/92  10/09/11 160/98  04/07/11 138/88

## 2012-01-06 NOTE — Patient Instructions (Signed)
Continue all other medications as before Please have the pharmacy call with any refills you may need. Please remember to followup with your GYN for the yearly pap smear and/or mammogram, as you do You are otherwise up to date with prevention Please return in 1 year for your yearly visit, or sooner if needed, with Lab testing done 3-5 days before

## 2012-01-08 ENCOUNTER — Encounter: Payer: Self-pay | Admitting: Internal Medicine

## 2012-01-08 NOTE — Progress Notes (Signed)
Subjective:    Patient ID: [name], female    DOB: 1970/10/06, 41 y.o.   MRN: [id number]  HPI  Here for wellness and f/u;  Overall doing ok;  Pt denies CP, worsening SOB, DOE, wheezing, orthopnea, PND, worsening LE edema, palpitations, dizziness or syncope.  Pt denies neurological change such as new Headache, facial or extremity weakness.  Pt denies polydipsia, polyuria, or low sugar symptoms. Pt states overall good compliance with treatment and medications, good tolerability, and trying to follow lower cholesterol diet.  Pt denies worsening depressive symptoms, suicidal ideation or panic. No fever, wt loss, night sweats, loss of appetite, or other constitutional symptoms.  Pt states good ability with ADL's, low fall risk, home safety reviewed and adequate, no significant changes in hearing or vision, and occasionally active with exercise.   Here also with 3 days acute onset mild low grade fever, head congestion, pressure, general weakness and malaise, but little to no cough. Past Medical History  Diagnosis Date  . ANEMIA-IRON DEFICIENCY 06/10/2010  . HYPERTENSION 06/10/2010  . MENORRHALGIA 06/10/2010  . CHEST PAIN 06/10/2010  . ABDOMINAL TENDERNESS 06/10/2010  . NEPHROLITHIASIS, HX OF 06/10/2010   Past Surgical History  Procedure Date  . Cholecystectomy   . Total abdominal hysterectomy     reports that she has never smoked. She does not have any smokeless tobacco history on file. She reports that she drinks alcohol. She reports that she does not use illicit drugs. family history includes Diabetes in her other; Hypertension in her others; and Stroke in her others. No Known Allergies Current Outpatient Prescriptions on File Prior to Visit  Medication Sig Dispense Refill  . amLODipine-benazepril (LOTREL) 10-20 MG per capsule TAKE ONE CAPSULE BY MOUTH DAILY  30 capsule  5  . aspirin 81 MG EC tablet Take 81 mg by mouth daily.        . ferrous sulfate (SLOW FE) 160 (50 FE) MG TBCR Take by  mouth daily.        . fluticasone (FLONASE) 50 MCG/ACT nasal spray Place 2 sprays into the nose daily.  16 g  3   Review of Systems Review of Systems  Constitutional: Negative for diaphoresis, activity change, appetite change and unexpected weight change.  HENT: Negative for hearing loss, ear pain, facial swelling, mouth sores and neck stiffness.   Eyes: Negative for pain, redness and visual disturbance.  Respiratory: Negative for shortness of breath and wheezing.   Cardiovascular: Negative for chest pain and palpitations.  Gastrointestinal: Negative for diarrhea, blood in stool, abdominal distention and rectal pain.  Genitourinary: Negative for hematuria, flank pain and decreased urine volume.  Musculoskeletal: Negative for myalgias and joint swelling.  Skin: Negative for color change and wound.  Neurological: Negative for syncope and numbness.  Hematological: Negative for adenopathy.  Psychiatric/Behavioral: Negative for hallucinations, self-injury, decreased concentration and agitation.      Objective:   Physical Exam BP 130/92  Pulse 73  Temp(Src) 97.5 F (36.4 C) (Oral)  Ht 5\' 2"  (1.575 m)  Wt 241 lb 2 oz (109.374 kg)  BMI 44.10 kg/m2  SpO2 99% Physical Exam  VS noted Constitutional: Pt is oriented to person, place, and time. Appears well-developed and well-nourished.  HENT:  Head: Normocephalic and atraumatic.  Right Ear: External ear normal.  Left Ear: External ear normal.  Nose: Nose normal.  Mouth/Throat: Oropharynx is clear and moist.  Bilat tm's mild erythema.  Sinus nontender.  Pharynx mild erythema Eyes: Conjunctivae and EOM are normal. Pupils  are equal, round, and reactive to light.  Neck: Normal range of motion. Neck supple. No JVD present. No tracheal deviation present.  Cardiovascular: Normal rate, regular rhythm, normal heart sounds and intact distal pulses.   Pulmonary/Chest: Effort normal and breath sounds normal.  Abdominal: Soft. Bowel sounds are  normal. There is no tenderness.  Musculoskeletal: Normal range of motion. Exhibits no edema.  Lymphadenopathy:  Has no cervical adenopathy.  Neurological: Pt is alert and oriented to person, place, and time. Pt has normal reflexes. No cranial nerve deficit.  Skin: Skin is warm and dry. No rash noted.  Psychiatric:  Has  normal mood and affect. Behavior is normal.     Assessment & Plan:

## 2012-04-04 ENCOUNTER — Other Ambulatory Visit: Payer: Self-pay | Admitting: Internal Medicine

## 2013-01-16 ENCOUNTER — Encounter: Payer: Managed Care, Other (non HMO) | Admitting: Internal Medicine

## 2013-01-29 ENCOUNTER — Other Ambulatory Visit (INDEPENDENT_AMBULATORY_CARE_PROVIDER_SITE_OTHER): Payer: Managed Care, Other (non HMO)

## 2013-01-29 DIAGNOSIS — Z0000 Encounter for general adult medical examination without abnormal findings: Secondary | ICD-10-CM

## 2013-01-29 LAB — HEPATIC FUNCTION PANEL
ALT: 27 U/L (ref 0–35)
AST: 25 U/L (ref 0–37)
Albumin: 3.9 g/dL (ref 3.5–5.2)
Total Bilirubin: 0.6 mg/dL (ref 0.3–1.2)
Total Protein: 7.4 g/dL (ref 6.0–8.3)

## 2013-01-29 LAB — URINALYSIS, ROUTINE W REFLEX MICROSCOPIC
Hgb urine dipstick: NEGATIVE
Ketones, ur: NEGATIVE
Leukocytes, UA: NEGATIVE
Specific Gravity, Urine: 1.02 (ref 1.000–1.030)
Urobilinogen, UA: 0.2 (ref 0.0–1.0)

## 2013-01-29 LAB — BASIC METABOLIC PANEL
Calcium: 8.7 mg/dL (ref 8.4–10.5)
GFR: 86.89 mL/min (ref >60.00)
Potassium: 3.9 mEq/L (ref 3.5–5.1)
Sodium: 140 mEq/L (ref 135–145)

## 2013-01-29 LAB — LIPID PANEL
Cholesterol: 169 mg/dL (ref 0–200)
HDL: 48.2 mg/dL (ref >39.00)
Triglycerides: 107 mg/dL (ref 0.0–149.0)
VLDL: 21.4 mg/dL (ref 0.0–40.0)

## 2013-01-29 LAB — CBC WITH DIFFERENTIAL/PLATELET
Eosinophils Relative: 3.1 % (ref 0.0–5.0)
HCT: 42.6 % (ref 36.0–46.0)
Hemoglobin: 14.7 g/dL (ref 12.0–15.0)
Lymphs Abs: 2.1 10*3/uL (ref 0.7–4.0)
MCV: 90.5 fl (ref 78.0–100.0)
Monocytes Absolute: 0.6 10*3/uL (ref 0.1–1.0)
Monocytes Relative: 11.4 % (ref 3.0–12.0)
Neutro Abs: 2.6 10*3/uL (ref 1.4–7.7)
Platelets: 200 10*3/uL (ref 150.0–400.0)
WBC: 5.5 10*3/uL (ref 4.5–10.5)

## 2013-01-29 LAB — TSH: TSH: 0.94 u[IU]/mL (ref 0.35–5.50)

## 2013-01-30 ENCOUNTER — Ambulatory Visit (INDEPENDENT_AMBULATORY_CARE_PROVIDER_SITE_OTHER): Payer: Managed Care, Other (non HMO) | Admitting: Internal Medicine

## 2013-01-30 ENCOUNTER — Encounter: Payer: Self-pay | Admitting: Internal Medicine

## 2013-01-30 VITALS — BP 110/80 | HR 67 | Temp 97.9°F | Ht 62.0 in | Wt 243.5 lb

## 2013-01-30 DIAGNOSIS — Z0000 Encounter for general adult medical examination without abnormal findings: Secondary | ICD-10-CM

## 2013-01-30 DIAGNOSIS — M79609 Pain in unspecified limb: Secondary | ICD-10-CM

## 2013-01-30 DIAGNOSIS — M79645 Pain in left finger(s): Secondary | ICD-10-CM

## 2013-01-30 NOTE — Progress Notes (Signed)
Subjective:    Patient ID: [name], female    DOB: [birth year], 42 y.o.   MRN: [id number]  HPI  Here for wellness and f/u;  Overall doing ok;  Pt denies CP, worsening SOB, DOE, wheezing, orthopnea, PND, worsening LE edema, palpitations, dizziness or syncope.  Pt denies neurological change such as new headache, facial or extremity weakness.  Pt denies polydipsia, polyuria, or low sugar symptoms. Pt states overall good compliance with treatment and medications, good tolerability, and has been trying to follow lower cholesterol diet.  Pt denies worsening depressive symptoms, suicidal ideation or panic. No fever, night sweats, wt loss, loss of appetite, or other constitutional symptoms.  Pt states good ability with ADL's, has low fall risk, home safety reviewed and adequate, no other significant changes in hearing or vision, and only occasionally active with exercise. No acute complaints except has 2 mo left 5th finger crooked persistent after accident. Past Medical History  Diagnosis Date  . ANEMIA-IRON DEFICIENCY 06/10/2010  . HYPERTENSION 06/10/2010  . MENORRHALGIA 06/10/2010  . CHEST PAIN 06/10/2010  . ABDOMINAL TENDERNESS 06/10/2010  . NEPHROLITHIASIS, HX OF 06/10/2010   Past Surgical History  Procedure Laterality Date  . Cholecystectomy    . Total abdominal hysterectomy      reports that she has never smoked. She does not have any smokeless tobacco history on file. She reports that  drinks alcohol. She reports that she does not use illicit drugs. family history includes Diabetes in her other; Hypertension in her others; and Stroke in her others. No Known Allergies Current Outpatient Prescriptions on File Prior to Visit  Medication Sig Dispense Refill  . amLODipine-benazepril (LOTREL) 10-20 MG per capsule TAKE ONE CAPSULE BY MOUTH DAILY  30 capsule  9  . aspirin 81 MG EC tablet Take 81 mg by mouth daily.        . ferrous sulfate (SLOW FE) 160 (50 FE) MG TBCR Take by mouth daily.          No current facility-administered medications on file prior to visit.   Review of Systems Constitutional: Negative for diaphoresis, activity change, appetite change or unexpected weight change.  HENT: Negative for hearing loss, ear pain, facial swelling, mouth sores and neck stiffness.   Eyes: Negative for pain, redness and visual disturbance.  Respiratory: Negative for shortness of breath and wheezing.   Cardiovascular: Negative for chest pain and palpitations.  Gastrointestinal: Negative for diarrhea, blood in stool, abdominal distention or other pain Genitourinary: Negative for hematuria, flank pain or change in urine volume.  Musculoskeletal: Negative for myalgias and joint swelling.  Skin: Negative for color change and wound.  Neurological: Negative for syncope and numbness. other than noted Hematological: Negative for adenopathy.  Psychiatric/Behavioral: Negative for hallucinations, self-injury, decreased concentration and agitation.      Objective:   Physical Exam BP 110/80  Pulse 67  Temp(Src) 97.9 F (36.6 C) (Oral)  Ht 5\' 2"  (1.575 m)  Wt 243 lb 8 oz (110.451 kg)  BMI 44.53 kg/m2  SpO2 99% VS noted,  Constitutional: Pt is oriented to person, place, and time. Appears well-developed and well-nourished.  Head: Normocephalic and atraumatic.  Right Ear: External ear normal.  Left Ear: External ear normal.  Nose: Nose normal.  Mouth/Throat: Oropharynx is clear and moist.  Eyes: Conjunctivae and EOM are normal. Pupils are equal, round, and reactive to light.  Neck: Normal range of motion. Neck supple. No JVD present. No tracheal deviation present.  Cardiovascular: Normal rate, regular rhythm, normal  heart sounds and intact distal pulses.   Pulmonary/Chest: Effort normal and breath sounds normal.  Abdominal: Soft. Bowel sounds are normal. There is no tenderness. No HSM  Musculoskeletal: Normal range of motion. Exhibits no edema.5th finger DIP 1+ swelling/tender, with  inability to full extension or flexion  Lymphadenopathy:  Has no cervical adenopathy.  Neurological: Pt is alert and oriented to person, place, and time. Pt has normal reflexes. No cranial nerve deficit.  Skin: Skin is warm and dry. No rash noted.  Psychiatric:  Has  normal mood and affect. Behavior is normal.     Assessment & Plan:

## 2013-01-30 NOTE — Assessment & Plan Note (Signed)
For finger cot , declines hand surgury for now

## 2013-01-30 NOTE — Assessment & Plan Note (Addendum)

## 2013-01-30 NOTE — Patient Instructions (Signed)
Please continue all other medications as before, and refills have been done if requested. Please keep your appointments with your specialists as you may have planned Please remember to followup with your GYN for the yearly pap smear and/or mammogram as you do Your EKG and labs were ok today Please have the pharmacy call with any other refills you may need. Please continue your efforts at being more active, low cholesterol diet, and weight control. You are otherwise up to date with prevention measures today.  Please call if you change your mind about the Hand Surgeon referral  Please remember to sign up for My Chart if you have not done so, as this will be important to you in the future with finding out test results, communicating by private email, and scheduling acute appointments online when needed.  Please return in 1 year for your yearly visit, or sooner if needed, with Lab testing done 3-5 days before

## 2013-04-12 ENCOUNTER — Other Ambulatory Visit: Payer: Self-pay | Admitting: Internal Medicine

## 2013-05-04 DIAGNOSIS — Z0279 Encounter for issue of other medical certificate: Secondary | ICD-10-CM

## 2014-01-21 LAB — HM MAMMOGRAPHY

## 2014-02-01 ENCOUNTER — Telehealth: Payer: Self-pay

## 2014-02-01 DIAGNOSIS — Z0000 Encounter for general adult medical examination without abnormal findings: Secondary | ICD-10-CM

## 2014-02-01 NOTE — Telephone Encounter (Signed)
cpx labs entered  

## 2014-04-17 ENCOUNTER — Other Ambulatory Visit: Payer: Self-pay | Admitting: Internal Medicine

## 2014-05-16 ENCOUNTER — Encounter: Payer: Managed Care, Other (non HMO) | Admitting: Internal Medicine

## 2014-09-02 ENCOUNTER — Other Ambulatory Visit (INDEPENDENT_AMBULATORY_CARE_PROVIDER_SITE_OTHER): Payer: Managed Care, Other (non HMO)

## 2014-09-02 DIAGNOSIS — Z0000 Encounter for general adult medical examination without abnormal findings: Secondary | ICD-10-CM

## 2014-09-02 LAB — HEPATIC FUNCTION PANEL
ALBUMIN: 3.7 g/dL (ref 3.5–5.2)
ALK PHOS: 67 U/L (ref 39–117)
ALT: 21 U/L (ref 0–35)
AST: 16 U/L (ref 0–37)
Bilirubin, Direct: 0 mg/dL (ref 0.0–0.3)
Total Bilirubin: 0.5 mg/dL (ref 0.2–1.2)
Total Protein: 6.9 g/dL (ref 6.0–8.3)

## 2014-09-02 LAB — LIPID PANEL
Cholesterol: 166 mg/dL (ref 0–200)
HDL: 49 mg/dL (ref >39.00)
LDL Cholesterol: 100 mg/dL — ABNORMAL HIGH (ref 0–99)
NONHDL: 117
TRIGLYCERIDES: 83 mg/dL (ref 0.0–149.0)
Total CHOL/HDL Ratio: 3
VLDL: 16.6 mg/dL (ref 0.0–40.0)

## 2014-09-02 LAB — URINALYSIS, ROUTINE W REFLEX MICROSCOPIC
Bilirubin Urine: NEGATIVE
HGB URINE DIPSTICK: NEGATIVE
KETONES UR: NEGATIVE
Nitrite: NEGATIVE
PH: 7 (ref 5.0–8.0)
SPECIFIC GRAVITY, URINE: 1.015 (ref 1.000–1.030)
TOTAL PROTEIN, URINE-UPE24: NEGATIVE
UROBILINOGEN UA: 0.2 (ref 0.0–1.0)
Urine Glucose: NEGATIVE

## 2014-09-02 LAB — CBC WITH DIFFERENTIAL/PLATELET
BASOS ABS: 0 10*3/uL (ref 0.0–0.1)
Basophils Relative: 0.6 % (ref 0.0–3.0)
EOS ABS: 0.1 10*3/uL (ref 0.0–0.7)
EOS PCT: 2.6 % (ref 0.0–5.0)
HCT: 41.7 % (ref 36.0–46.0)
Hemoglobin: 14.3 g/dL (ref 12.0–15.0)
LYMPHS ABS: 1.9 10*3/uL (ref 0.7–4.0)
Lymphocytes Relative: 36.4 % (ref 12.0–46.0)
MCHC: 34.2 g/dL (ref 30.0–36.0)
MCV: 87.1 fl (ref 78.0–100.0)
Monocytes Absolute: 0.5 10*3/uL (ref 0.1–1.0)
Monocytes Relative: 9.8 % (ref 3.0–12.0)
Neutro Abs: 2.6 10*3/uL (ref 1.4–7.7)
Neutrophils Relative %: 50.6 % (ref 43.0–77.0)
Platelets: 237 10*3/uL (ref 150.0–400.0)
RBC: 4.78 Mil/uL (ref 3.87–5.11)
RDW: 13.6 % (ref 11.5–15.5)
WBC: 5.1 10*3/uL (ref 4.0–10.5)

## 2014-09-02 LAB — BASIC METABOLIC PANEL
BUN: 8 mg/dL (ref 6–23)
CALCIUM: 9.2 mg/dL (ref 8.4–10.5)
CHLORIDE: 105 meq/L (ref 96–112)
CO2: 26 mEq/L (ref 19–32)
CREATININE: 0.81 mg/dL (ref 0.40–1.20)
GFR: 98.65 mL/min (ref >60.00)
Glucose, Bld: 103 mg/dL — ABNORMAL HIGH (ref 70–99)
POTASSIUM: 4.3 meq/L (ref 3.5–5.1)
SODIUM: 138 meq/L (ref 135–145)

## 2014-09-02 LAB — TSH: TSH: 0.75 u[IU]/mL (ref 0.35–4.50)

## 2014-09-06 ENCOUNTER — Ambulatory Visit (INDEPENDENT_AMBULATORY_CARE_PROVIDER_SITE_OTHER): Payer: Managed Care, Other (non HMO) | Admitting: Internal Medicine

## 2014-09-06 ENCOUNTER — Encounter: Payer: Self-pay | Admitting: Internal Medicine

## 2014-09-06 VITALS — BP 152/100 | HR 93 | Temp 98.2°F | Ht 62.0 in | Wt 241.8 lb

## 2014-09-06 DIAGNOSIS — Z0000 Encounter for general adult medical examination without abnormal findings: Secondary | ICD-10-CM

## 2014-09-06 DIAGNOSIS — I10 Essential (primary) hypertension: Secondary | ICD-10-CM

## 2014-09-06 LAB — EKG 12-LEAD

## 2014-09-06 NOTE — Progress Notes (Signed)
Pre visit review using our clinic review tool, if applicable. No additional management support is needed unless otherwise documented below in the visit note. 

## 2014-09-06 NOTE — Patient Instructions (Signed)
Your EKG was OK today  Your lab work was OK today  Please take your blood pressure medication earlier in the AM (no need to wait until 10 AM)  Please continue all other medications as before, and refills have been done if requested.  Please have the pharmacy call with any other refills you may need.  Please continue your efforts at being more active, low cholesterol diet, and weight control.  You are otherwise up to date with prevention measures today.  Please keep your appointments with your specialists as you may have planned  Please return in 6 months, or sooner if needed

## 2014-09-06 NOTE — Progress Notes (Signed)
Subjective:    Patient ID: [name], female    DOB: Apr 17, 1971, 44 y.o.   MRN: [id number]  HPI  Here for wellness and f/u;  Overall doing ok;  Pt denies CP, worsening SOB, DOE, wheezing, orthopnea, PND, worsening LE edema, palpitations, dizziness or syncope.  Pt denies neurological change such as new headache, facial or extremity weakness.  Pt denies polydipsia, polyuria, or low sugar symptoms. Pt states overall good compliance with treatment and medications, good tolerability, and has been trying to follow lower cholesterol diet.  Pt denies worsening depressive symptoms, suicidal ideation or panic. No fever, night sweats, wt loss, loss of appetite, or other constitutional symptoms.  Pt states good ability with ADL's, has low fall risk, home safety reviewed and adequate, no other significant changes in hearing or vision, and only occasionally active with exercise. Has not taken BP med yet this am, for some reason takes at 10 am. No current complaints Past Medical History  Diagnosis Date  . ANEMIA-IRON DEFICIENCY 06/10/2010  . HYPERTENSION 06/10/2010  . MENORRHALGIA 06/10/2010  . CHEST PAIN 06/10/2010  . ABDOMINAL TENDERNESS 06/10/2010  . NEPHROLITHIASIS, HX OF 06/10/2010   Past Surgical History  Procedure Laterality Date  . Cholecystectomy    . Total abdominal hysterectomy      reports that she has never smoked. She does not have any smokeless tobacco history on file. She reports that she drinks alcohol. She reports that she does not use illicit drugs. family history includes Diabetes in her other; Hypertension in her other, other, and other; Stroke in her other and other. No Known Allergies Current Outpatient Prescriptions on File Prior to Visit  Medication Sig Dispense Refill  . amLODipine-benazepril (LOTREL) 10-20 MG per capsule TAKE ONE CAPSULE BY MOUTH DAILY 30 capsule 11  . aspirin 81 MG EC tablet Take 81 mg by mouth daily.       No current facility-administered medications on  file prior to visit.     Review of Systems Constitutional: Negative for increased diaphoresis, other activity, appetite or other siginficant weight change  HENT: Negative for worsening hearing loss, ear pain, facial swelling, mouth sores and neck stiffness.   Eyes: Negative for other worsening pain, redness or visual disturbance.  Respiratory: Negative for shortness of breath and wheezing.   Cardiovascular: Negative for chest pain and palpitations.  Gastrointestinal: Negative for diarrhea, blood in stool, abdominal distention or other pain Genitourinary: Negative for hematuria, flank pain or change in urine volume.  Musculoskeletal: Negative for myalgias or other joint complaints.  Skin: Negative for color change and wound.  Neurological: Negative for syncope and numbness. other than noted Hematological: Negative for adenopathy. or other swelling Psychiatric/Behavioral: Negative for hallucinations, self-injury, decreased concentration or other worsening agitation.      Objective:   Physical Exam BP 152/100 mmHg  Pulse 93  Temp(Src) 98.2 F (36.8 C) (Oral)  Ht 5\' 2"  (1.575 m)  Wt 241 lb 12 oz (109.657 kg)  BMI 44.21 kg/m2  SpO2 97% VS noted,  Constitutional: Pt is oriented to person, place, and time. Appears well-developed and well-nourished.  Head: Normocephalic and atraumatic.  Right Ear: External ear normal.  Left Ear: External ear normal.  Nose: Nose normal.  Mouth/Throat: Oropharynx is clear and moist.  Eyes: Conjunctivae and EOM are normal. Pupils are equal, round, and reactive to light.  Neck: Normal range of motion. Neck supple. No JVD present. No tracheal deviation present.  Cardiovascular: Normal rate, regular rhythm, normal heart sounds and intact  distal pulses.   Pulmonary/Chest: Effort normal and breath sounds without rales or wheezing  Abdominal: Soft. Bowel sounds are normal. NT. No HSM  Musculoskeletal: Normal range of motion. Exhibits no edema.    Lymphadenopathy:  Has no cervical adenopathy.  Neurological: Pt is alert and oriented to person, place, and time. Pt has normal reflexes. No cranial nerve deficit. Motor grossly intact Skin: Skin is warm and dry. No rash noted.  Psychiatric:  Has normal mood and affect. Behavior is normal.   Wt Readings from Last 3 Encounters:  09/06/14 241 lb 12 oz (109.657 kg)  01/30/13 243 lb 8 oz (110.451 kg)  01/06/12 241 lb 2 oz (109.374 kg)   BP Readings from Last 3 Encounters:  09/06/14 152/100  01/30/13 110/80  01/06/12 130/92      Assessment & Plan:

## 2014-09-07 NOTE — Assessment & Plan Note (Addendum)

## 2014-09-07 NOTE — Assessment & Plan Note (Signed)
Mild elev today, to take meds earlier in AM for better control, f/u BP at home and next visit

## 2014-10-21 ENCOUNTER — Ambulatory Visit: Payer: Managed Care, Other (non HMO)

## 2014-10-25 ENCOUNTER — Ambulatory Visit: Payer: Managed Care, Other (non HMO) | Admitting: Internal Medicine

## 2014-11-05 ENCOUNTER — Ambulatory Visit (INDEPENDENT_AMBULATORY_CARE_PROVIDER_SITE_OTHER): Payer: Managed Care, Other (non HMO) | Admitting: Internal Medicine

## 2014-11-05 ENCOUNTER — Telehealth: Payer: Self-pay | Admitting: Internal Medicine

## 2014-11-05 ENCOUNTER — Encounter: Payer: Self-pay | Admitting: Internal Medicine

## 2014-11-05 VITALS — BP 142/82 | HR 80 | Temp 98.5°F | Resp 18 | Ht 62.0 in | Wt 244.0 lb

## 2014-11-05 DIAGNOSIS — I10 Essential (primary) hypertension: Secondary | ICD-10-CM | POA: Diagnosis not present

## 2014-11-05 MED ORDER — AMLODIPINE BESY-BENAZEPRIL HCL 10-40 MG PO CAPS: 1.0000 | ORAL_CAPSULE | Freq: Every day | ORAL | Status: DC

## 2014-11-05 NOTE — Progress Notes (Signed)
Pre visit review using our clinic review tool, if applicable. No additional management support is needed unless otherwise documented below in the visit note. 

## 2014-11-05 NOTE — Patient Instructions (Signed)
OK to increase the Lotrel blood pressure medication to 10/40 mg per day  Please continue all other medications as before, and refills have been done if requested.  Please have the pharmacy call with any other refills you may need.  Please continue your efforts at being more active, low cholesterol diet, and weight control.  Please keep your appointments with your specialists as you may have planned  Your form should be filled out and faxed soon

## 2014-11-05 NOTE — Assessment & Plan Note (Signed)
Mild uncontrolled, asympt, o/w stable overall by history and exam, recent data reviewed with pt, and pt to increase the lotrel to 10/40 qd,  to f/u any worsening symptoms or concerns

## 2014-11-05 NOTE — Telephone Encounter (Signed)
emmi emailed °

## 2014-11-05 NOTE — Progress Notes (Signed)
   Subjective:    Patient ID: [name], female    DOB: 05-Oct-1970, 44 y.o.   MRN: [id number]  HPI  Here to f/u for BP, as although wt has remained stable, BP has remained mild elevated despite good med compliance.  Pt denies chest pain, increased sob or doe, wheezing, orthopnea, PND, increased LE swelling, palpitations, dizziness or syncope.  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 ANEMIA-IRON DEFICIENCY 06/10/2010  . HYPERTENSION 06/10/2010  . MENORRHALGIA 06/10/2010  . CHEST PAIN 06/10/2010  . ABDOMINAL TENDERNESS 06/10/2010  . NEPHROLITHIASIS, HX OF 06/10/2010   Past Surgical History  Procedure Laterality Date  . Cholecystectomy    . Total abdominal hysterectomy      reports that she has never smoked. She does not have any smokeless tobacco history on file. She reports that she drinks alcohol. She reports that she does not use illicit drugs. family history includes Diabetes in her other; Hypertension in her other, other, and other; Stroke in her other and other. No Known Allergies Current Outpatient Prescriptions on File Prior to Visit  Medication Sig Dispense Refill  . aspirin 81 MG EC tablet Take 81 mg by mouth daily.       No current facility-administered medications on file prior to visit.   Review of Systems All otherwise neg per pt     Objective:   Physical Exam BP 142/82 mmHg  Pulse 80  Temp(Src) 98.5 F (36.9 C) (Oral)  Resp 18  Ht 5\' 2"  (1.575 m)  Wt 244 lb (110.678 kg)  BMI 44.62 kg/m2  SpO2 94% VS noted,    Constitutional: Pt appears in no significant distress HENT: Head: NCAT.  Right Ear: External ear normal.  Left Ear: External ear normal.  Eyes: . Pupils are equal, round, and reactive to light. Conjunctivae and EOM are normal Neck: Normal range of motion. Neck supple.  Cardiovascular: Normal rate and regular rhythm.   Pulmonary/Chest: Effort normal and breath sounds without rales or wheezing.  Neurological: Pt is alert. Not confused , motor grossly intact Skin: Skin is warm. No rash, no LE edema Psychiatric: Pt behavior is normal. No agitation.     Assessment & Plan:

## 2015-11-06 ENCOUNTER — Encounter: Payer: Self-pay | Admitting: Internal Medicine

## 2015-11-06 ENCOUNTER — Ambulatory Visit (INDEPENDENT_AMBULATORY_CARE_PROVIDER_SITE_OTHER): Payer: Managed Care, Other (non HMO) | Admitting: Internal Medicine

## 2015-11-06 VITALS — BP 138/82 | HR 97 | Temp 98.3°F | Resp 20 | Wt 236.0 lb

## 2015-11-06 DIAGNOSIS — I10 Essential (primary) hypertension: Secondary | ICD-10-CM | POA: Diagnosis not present

## 2015-11-06 DIAGNOSIS — J069 Acute upper respiratory infection, unspecified: Secondary | ICD-10-CM | POA: Diagnosis not present

## 2015-11-06 MED ORDER — HYDROCODONE-HOMATROPINE 5-1.5 MG/5ML PO SYRP: 5.0000 mL | ORAL_SOLUTION | Freq: Four times a day (QID) | ORAL | Status: DC | PRN

## 2015-11-06 MED ORDER — AZITHROMYCIN 250 MG PO TABS: ORAL_TABLET | ORAL | Status: DC

## 2015-11-06 NOTE — Progress Notes (Signed)
Pre visit review using our clinic review tool, if applicable. No additional management support is needed unless otherwise documented below in the visit note. 

## 2015-11-06 NOTE — Assessment & Plan Note (Signed)
stable overall by history and exam, recent data reviewed with pt, and pt to continue medical treatment as before,  to f/u any worsening symptoms or concerns BP Readings from Last 3 Encounters:  11/06/15 138/82  11/05/14 142/82  10/21/14 148/90

## 2015-11-06 NOTE — Patient Instructions (Addendum)
Your flu test was negative  Please take all new medication as prescribed - the antibiotic, and cough medicine as needed  Please continue all other medications as before, and refills have been done if requested.  Please have the pharmacy call with any other refills you may need.  Please keep your appointments with your specialists as you may have planned   

## 2015-11-06 NOTE — Progress Notes (Signed)
   Subjective:    Patient ID: [name], female    DOB: [birth year], 45 y.o.   MRN: [id number]  HPI    Here with 2-3 days acute onset fever, facial pain, pressure, headache, general weakness and malaise, and small d/c, with mild ST and cough, but pt denies chest pain, wheezing, increased sob or doe, orthopnea, PND, increased LE swelling, palpitations, dizziness or syncope. Supervisor with proven influenza per pt. Pt with marked increased sneezing today,  Missed work yest and todayl  Has Lost wt intentionally with better diet. Wt Readings from Last 3 Encounters:  11/06/15 236 lb (107.049 kg)  11/05/14 244 lb (110.678 kg)  09/06/14 241 lb 12 oz (109.657 kg)  Pt denies new neurological symptoms such as new headache, or facial or extremity weakness or numbness   Pt denies polydipsia, polyuria    Past Medical History  Diagnosis Date  . ANEMIA-IRON DEFICIENCY 06/10/2010  . HYPERTENSION 06/10/2010  . MENORRHALGIA 06/10/2010  . CHEST PAIN 06/10/2010  . ABDOMINAL TENDERNESS 06/10/2010  . NEPHROLITHIASIS, HX OF 06/10/2010   Past Surgical History  Procedure Laterality Date  . Cholecystectomy    . Total abdominal hysterectomy      reports that she has never smoked. She does not have any smokeless tobacco history on file. She reports that she drinks alcohol. She reports that she does not use illicit drugs. family history includes Diabetes in her other; Hypertension in her other, other, and other; Stroke in her other and other. No Known Allergies Current Outpatient Prescriptions on File Prior to Visit  Medication Sig Dispense Refill  . amLODipine-benazepril (LOTREL) 10-40 MG per capsule Take 1 capsule by mouth daily. 90 capsule 3  . aspirin 81 MG EC tablet Take 81 mg by mouth daily.       No current facility-administered medications on file prior to visit.    Review of Systems  Constitutional: Negative for unusual diaphoresis or night sweats HENT: Negative for ear swelling or discharge Eyes:  Negative for worsening visual haziness  Respiratory: Negative for choking and stridor.   Gastrointestinal: Negative for distension or worsening eructation Genitourinary: Negative for retention or change in urine volume.  Musculoskeletal: Negative for other MSK pain or swelling Skin: Negative for color change and worsening wound Neurological: Negative for tremors and numbness other than noted  Psychiatric/Behavioral: Negative for decreased concentration or agitation other than above       Objective:   Physical Exam BP 138/82 mmHg  Pulse 97  Temp(Src) 98.3 F (36.8 C) (Oral)  Resp 20  Wt 236 lb (107.049 kg)  SpO2 97% VS noted, mild ill Constitutional: Pt appears in no apparent distress HENT: Head: NCAT.  Right Ear: External ear normal.  Left Ear: External ear normal.  Bilat tm's with mild erythema, left > right  Max sinus areas non tender.  Pharynx with mild erythema, no exudate Eyes: . Pupils are equal, round, and reactive to light. Conjunctivae and EOM are normal Neck: Normal range of motion. Neck supple.  Cardiovascular: Normal rate and regular rhythm.   Pulmonary/Chest: Effort normal and breath sounds without rales or wheezing.  Abd:  Soft, NT, ND, + BS Neurological: Pt is alert. Not confused , motor grossly intact Skin: Skin is warm. No rash, no LE edema Psychiatric: Pt behavior is normal. No agitation.   Influenza rapid  - neg    Assessment & Plan:

## 2015-11-06 NOTE — Assessment & Plan Note (Signed)
Mild to mod, for antibx course,  to f/u any worsening symptoms or concerns 

## 2015-11-18 ENCOUNTER — Encounter: Payer: Managed Care, Other (non HMO) | Admitting: Internal Medicine

## 2015-12-26 ENCOUNTER — Telehealth: Payer: Self-pay

## 2015-12-26 NOTE — Telephone Encounter (Signed)
Paperwork signed, faxed, copy sent to scan 

## 2015-12-26 NOTE — Telephone Encounter (Signed)
[facility] Paperwork received and placed on MD's desk for signature

## 2016-01-02 ENCOUNTER — Other Ambulatory Visit: Payer: Self-pay | Admitting: Internal Medicine

## 2016-06-16 ENCOUNTER — Ambulatory Visit (INDEPENDENT_AMBULATORY_CARE_PROVIDER_SITE_OTHER): Payer: Managed Care, Other (non HMO) | Admitting: Internal Medicine

## 2016-06-16 ENCOUNTER — Encounter: Payer: Self-pay | Admitting: Internal Medicine

## 2016-06-16 ENCOUNTER — Ambulatory Visit (INDEPENDENT_AMBULATORY_CARE_PROVIDER_SITE_OTHER)
Admission: RE | Admit: 2016-06-16 | Discharge: 2016-06-16 | Disposition: A | Discharge status: Home / Self Care | Payer: Managed Care, Other (non HMO) | Source: Ambulatory Visit | Attending: Internal Medicine | Admitting: Internal Medicine

## 2016-06-16 VITALS — BP 142/82 | HR 91 | Temp 98.1°F | Resp 20 | Wt 246.0 lb

## 2016-06-16 DIAGNOSIS — M25561 Pain in right knee: Secondary | ICD-10-CM

## 2016-06-16 DIAGNOSIS — M25571 Pain in right ankle and joints of right foot: Secondary | ICD-10-CM | POA: Diagnosis not present

## 2016-06-16 DIAGNOSIS — I10 Essential (primary) hypertension: Secondary | ICD-10-CM | POA: Diagnosis not present

## 2016-06-16 DIAGNOSIS — M25572 Pain in left ankle and joints of left foot: Secondary | ICD-10-CM

## 2016-06-16 DIAGNOSIS — Z0001 Encounter for general adult medical examination with abnormal findings: Secondary | ICD-10-CM | POA: Diagnosis not present

## 2016-06-16 MED ORDER — DICLOFENAC SODIUM 1 % TD GEL: 4.0000 g | Freq: Four times a day (QID) | TRANSDERMAL | 1 refills | Status: DC | PRN

## 2016-06-16 MED ORDER — IBUPROFEN 800 MG PO TABS: 800.0000 mg | ORAL_TABLET | Freq: Three times a day (TID) | ORAL | 1 refills | Status: DC | PRN

## 2016-06-16 NOTE — Progress Notes (Signed)
   Subjective:    Patient ID: [name], female    DOB: [birth year], 45 y.o.   MRN: [id number]  HPI  Here to f/u with acute pain to right knee/upper anterior leg, also ankle pain with swelling for 5 days; works as city school bus driver, was getting off the bus and had twisted to the right and stopped off with right leg but had pain just after doing this; mild to mod, constant, sharp, without giveaways or falls.  Pt denies chest pain, increased sob or doe, wheezing, orthopnea, PND, increased LE swelling, palpitations, dizziness or syncope. No leg or calf swelling, no hx of DVT.  Pt denies new neurological symptoms such as new headache, or facial or extremity weakness or numbness   Pt denies polydipsia, polyuria Past Medical History:  Diagnosis Date  . ABDOMINAL TENDERNESS 06/10/2010  . ANEMIA-IRON DEFICIENCY 06/10/2010  . CHEST PAIN 06/10/2010  . HYPERTENSION 06/10/2010  . MENORRHALGIA 06/10/2010  . NEPHROLITHIASIS, HX OF 06/10/2010   Past Surgical History:  Procedure Laterality Date  . CHOLECYSTECTOMY    . TOTAL ABDOMINAL HYSTERECTOMY      reports that she has never smoked. She does not have any smokeless tobacco history on file. She reports that she drinks alcohol. She reports that she does not use drugs. family history includes Diabetes in her other; Hypertension in her other, other, and other; Stroke in her other and other. No Known Allergies Current Outpatient Prescriptions on File Prior to Visit  Medication Sig Dispense Refill  . amLODipine-benazepril (LOTREL) 10-20 MG capsule TAKE ONE CAPSULE BY MOUTH DAILY 30 capsule 10  . aspirin 81 MG EC tablet Take 81 mg by mouth daily.       No current facility-administered medications on file prior to visit.    Review of Systems  Constitutional: Negative for unusual diaphoresis or night sweats HENT: Negative for ear swelling or discharge Eyes: Negative for worsening visual haziness  Respiratory: Negative for choking and stridor.     Gastrointestinal: Negative for distension or worsening eructation Genitourinary: Negative for retention or change in urine volume.  Musculoskeletal: Negative for other MSK pain or swelling Skin: Negative for color change and worsening wound Neurological: Negative for tremors and numbness other than noted  Psychiatric/Behavioral: Negative for decreased concentration or agitation other than above   All other system neg per pt    Objective:   Physical Exam BP (!) 142/82   Pulse 91   Temp 98.1 F (36.7 C) (Oral)   Resp 20   Wt 246 lb (111.6 kg)   SpO2 96%   BMI 44.99 kg/m  VS noted,  Constitutional: Pt appears in no apparent distress HENT: Head: NCAT.  Right Ear: External ear normal.  Left Ear: External ear normal.  Eyes: . Pupils are equal, round, and reactive to light. Conjunctivae and EOM are normal Neck: Normal range of motion. Neck supple.  Cardiovascular: Normal rate and regular rhythm.   Pulmonary/Chest: Effort normal and breath sounds without rales or wheezing.  Right knee with crepitus, bony changes, No effusion, NT except for possible mild patellar tendon tenderness, also right ankle with reduced ROM, trace to 1+ effusion, mild warm and tender only Neurological: Pt is alert. Not confused , motor grossly intact Skin: Skin is warm. No rash, no LE edema Psychiatric: Pt behavior is normal. No agitation. ' No other significant exam findings    Assessment & Plan:

## 2016-06-16 NOTE — Patient Instructions (Addendum)
Please take all new medication as prescribed - the anti-inflammatory pills and topical gel  Please continue all other medications as before, and refills have been done if requested.  Please have the pharmacy call with any other refills you may need.  You will be contacted regarding the referral for: Dr Tamala Julian (sports medicine) but you can make an appointment as you leave today as well  Please keep your appointments with your specialists as you may have planned  Please go to the XRAY Department in the Basement (go straight as you get off the elevator) for the x-ray testing  You will be contacted by phone if any changes need to be made immediately.  Otherwise, you will receive a letter about your results with an explanation, but please check with MyChart first.  Please remember to sign up for MyChart if you have not done so, as this will be important to you in the future with finding out test results, communicating by private email, and scheduling acute appointments online when needed.  Please return in 3 months, or sooner if needed, with Lab testing done 3-5 days before

## 2016-06-16 NOTE — Assessment & Plan Note (Signed)
Mild elevated today, ? Due to pain, pt declines any change in tx, o/w stable overall by history and exam, recent data reviewed with pt, and pt to continue medical treatment as before,  to f/u any worsening symptoms or concerns BP Readings from Last 3 Encounters:  06/16/16 (!) 142/82  11/06/15 138/82  11/05/14 (!) 142/82

## 2016-06-16 NOTE — Assessment & Plan Note (Signed)
?   DJD vs tendonitis, for film, nsaid prn, refer Dr Smith/sport med in this office

## 2016-06-16 NOTE — Assessment & Plan Note (Signed)
Suspect underlying DJD, for film, nsaid and volt gel prn,  to f/u any worsening symptoms or concerns

## 2016-06-16 NOTE — Progress Notes (Signed)
Pre visit review using our clinic review tool, if applicable. No additional management support is needed unless otherwise documented below in the visit note. 

## 2016-06-17 ENCOUNTER — Encounter: Payer: Self-pay | Admitting: Internal Medicine

## 2016-06-23 NOTE — Progress Notes (Deleted)
[facility] [address] [place], [place] [address] Phone: [phone] Subjective:    I'm seeing this patient by the request  of:  Cathlean Cower, MD   CC: Right leg pain  [id number]  [name] is a 45 y.o. female coming in with complaint of right leg pain. Seems to be more her knee. States that it goes to the upper anterior leg as well. Tingling down to the ankle. Has had swelling for approximate 5 days and went and saw primary care provider. There is a concern for arthritis. Patient was sent for x-rays of patient's knee as well as ankle on the side. These were independently visualized by me.  Previous imaging taken 06/17/2016 shows patient's right knee has no significant bony abnormality. Right ankle showed some mild soft tissue swelling but otherwise unremarkable   patient states  Past Medical History:  Diagnosis Date  . ABDOMINAL TENDERNESS 06/10/2010  . ANEMIA-IRON DEFICIENCY 06/10/2010  . CHEST PAIN 06/10/2010  . HYPERTENSION 06/10/2010  . MENORRHALGIA 06/10/2010  . NEPHROLITHIASIS, HX OF 06/10/2010   Past Surgical History:  Procedure Laterality Date  . CHOLECYSTECTOMY    . TOTAL ABDOMINAL HYSTERECTOMY     Social History   Social History  . Marital status: Married    Spouse name: N/A  . Number of children: 1  . Years of education: N/A   Occupational History  . 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 Smoking status: Never Smoker  . Smokeless tobacco: Not on file  . Alcohol use Yes     Comment: social  . Drug use: No  . Sexual activity: Not on file   Other Topics Concern  . Not on file   Social History Narrative  . No narrative on file   No Known Allergies Family History  Problem Relation Age of Onset  . Stroke Other   . Hypertension Other   . Stroke Other   . Hypertension Other   .  Diabetes Other   . Hypertension Other     Past medical history, social, surgical and family history all reviewed in electronic medical record.  No pertanent information unless stated regarding to the chief complaint.   Review of Systems:Review of systems updated and as accurate as of 06/23/16  No headache, visual changes, nausea, vomiting, diarrhea, constipation, dizziness, abdominal pain, skin rash, fevers, chills, night sweats, weight loss, swollen lymph nodes, body aches, joint swelling, muscle aches, chest pain, shortness of breath, mood changes.   Objective  There were no vitals taken for this visit. Systems examined below as of 06/23/16   General: No apparent distress alert and oriented x3 mood and affect normal, dressed appropriately.  HEENT: Pupils equal, extraocular movements intact  Respiratory: Patient's speak in full sentences and does not appear short of breath  Cardiovascular: No lower extremity edema, non tender, no erythema  Skin: Warm dry intact with no signs of infection or rash on extremities or on axial skeleton.  Abdomen: Soft nontender  Neuro: Cranial nerves II through XII are intact, neurovascularly intact in all extremities with 2+ DTRs and 2+ pulses.  Lymph: No lymphadenopathy of posterior or anterior cervical chain or axillae bilaterally.  Gait normal with good balance and coordination.  MSK:  Non tender with full range of motion and good stability and  symmetric strength and tone of shoulders, elbows, wrist, hips bilaterally.  Knee: Normal to inspection with no erythema or effusion or obvious bony abnormalities. Palpation normal with no warmth, joint line tenderness, patellar tenderness, or condyle tenderness. ROM full in flexion and extension and lower leg rotation. Ligaments with solid consistent endpoints including ACL, PCL, LCL, MCL. Negative Mcmurray's, Apley's, and Thessalonian tests. Non painful patellar compression. Patellar glide without  crepitus. Patellar and quadriceps tendons unremarkable. Hamstring and quadriceps strength is normal.    Ankle: No visible erythema or swelling. Range of motion is full in all directions. Strength is 5/5 in all directions. Stable lateral and medial ligaments; squeeze test and kleiger test unremarkable; Talar dome nontender; No pain at base of 5th MT; No tenderness over cuboid; No tenderness over N spot or navicular prominence No tenderness on posterior aspects of lateral and medial malleolus No sign of peroneal tendon subluxations or tenderness to palpation Negative tarsal tunnel tinel's Able to walk 4 steps.    Impression and Recommendations:     This case required medical decision making of moderate complexity.      Note: This dictation was prepared with Dragon dictation along with smaller phrase technology. Any transcriptional errors that result from this process are unintentional.

## 2016-06-24 ENCOUNTER — Ambulatory Visit: Payer: Managed Care, Other (non HMO) | Admitting: Family Medicine

## 2016-10-01 ENCOUNTER — Encounter: Payer: Managed Care, Other (non HMO) | Admitting: Internal Medicine

## 2016-11-10 ENCOUNTER — Encounter: Payer: Self-pay | Admitting: Internal Medicine

## 2016-11-10 ENCOUNTER — Other Ambulatory Visit: Payer: Managed Care, Other (non HMO)

## 2016-11-10 ENCOUNTER — Ambulatory Visit (INDEPENDENT_AMBULATORY_CARE_PROVIDER_SITE_OTHER): Payer: Managed Care, Other (non HMO) | Admitting: Internal Medicine

## 2016-11-10 VITALS — BP 128/80 | HR 81 | Temp 98.4°F | Ht 62.0 in | Wt 242.0 lb

## 2016-11-10 DIAGNOSIS — Z0000 Encounter for general adult medical examination without abnormal findings: Secondary | ICD-10-CM | POA: Diagnosis not present

## 2016-11-10 NOTE — Patient Instructions (Signed)

## 2016-11-10 NOTE — Progress Notes (Signed)
Subjective:    Patient ID: [name], female    DOB: 31-Dec-1970, 46 y.o.   MRN: [id number]  HPI  Here for wellness and f/u;  Overall doing ok;  Pt denies Chest pain, worsening SOB, DOE, wheezing, orthopnea, PND, worsening LE edema, palpitations, dizziness or syncope.  Pt denies neurological change such as new headache, facial or extremity weakness.  Pt denies polydipsia, polyuria, or low sugar symptoms. Pt states overall good compliance with treatment and medications, good tolerability, and has been trying to follow appropriate diet.  Pt denies worsening depressive symptoms, suicidal ideation or panic. No fever, night sweats, wt loss, loss of appetite, or other constitutional symptoms.  Pt states good ability with ADL's, has low fall risk, home safety reviewed and adequate, no other significant changes in hearing or vision, and only occasionally active with exercise.  No other new history except has some bilat post heel pain with more walking recently Wt Readings from Last 3 Encounters:  11/10/16 242 lb (109.8 kg)  06/16/16 246 lb (111.6 kg)  11/06/15 236 lb (107 kg)   Past Medical History:  Diagnosis Date  . ABDOMINAL TENDERNESS 06/10/2010  . ANEMIA-IRON DEFICIENCY 06/10/2010  . CHEST PAIN 06/10/2010  . HYPERTENSION 06/10/2010  . MENORRHALGIA 06/10/2010  . NEPHROLITHIASIS, HX OF 06/10/2010   Past Surgical History:  Procedure Laterality Date  . CHOLECYSTECTOMY    . TOTAL ABDOMINAL HYSTERECTOMY      reports that she has never smoked. She has never used smokeless tobacco. She reports that she drinks alcohol. She reports that she does not use drugs. family history includes Diabetes in her other; Hypertension in her other, other, and other; Stroke in her other and other. No Known Allergies Current Outpatient Prescriptions on File Prior to Visit  Medication Sig Dispense Refill  . amLODipine-benazepril (LOTREL) 10-20 MG capsule TAKE ONE CAPSULE BY MOUTH DAILY 30 capsule 10  . aspirin 81 MG  EC tablet Take 81 mg by mouth daily.      . diclofenac sodium (VOLTAREN) 1 % GEL Apply 4 g topically 4 (four) times daily as needed. 400 g 1  . ibuprofen (ADVIL,MOTRIN) 800 MG tablet Take 1 tablet (800 mg total) by mouth every 8 (eight) hours as needed. 60 tablet 1   No current facility-administered medications on file prior to visit.    Review of Systems Constitutional: Negative for other unusual diaphoresis, sweats, appetite or weight changes HENT: Negative for other worsening hearing loss, ear pain, facial swelling, mouth sores or neck stiffness.   Eyes: Negative for other worsening pain, redness or other visual disturbance.  Respiratory: Negative for other stridor or swelling Cardiovascular: Negative for other palpitations or other chest pain  Gastrointestinal: Negative for worsening diarrhea or loose stools, blood in stool, distention or other pain Genitourinary: Negative for hematuria, flank pain or other change in urine volume.  Musculoskeletal: Negative for myalgias or other joint swelling.  Skin: Negative for other color change, or other wound or worsening drainage.  Neurological: Negative for other syncope or numbness. Hematological: Negative for other adenopathy or swelling Psychiatric/Behavioral: Negative for hallucinations, other worsening agitation, SI, self-injury, or new decreased concentration All other system neg per pt    Objective:   Physical Exam BP 128/80   Pulse 81   Temp 98.4 F (36.9 C) (Oral)   Ht 5\' 2"  (1.575 m)   Wt 242 lb (109.8 kg)   SpO2 98%   BMI 44.26 kg/m  VS noted,  Constitutional: Pt is oriented to  person, place, and time. Appears well-developed and well-nourished, in no significant distress and comfortable Head: Normocephalic and atraumatic  Eyes: Conjunctivae and EOM are normal. Pupils are equal, round, and reactive to light Right Ear: External ear normal without discharge Left Ear: External ear normal without discharge Nose: Nose without  discharge or deformity Mouth/Throat: Oropharynx is without other ulcerations and moist  Neck: Normal range of motion. Neck supple. No JVD present. No tracheal deviation present or significant neck LA or mass Cardiovascular: Normal rate, regular rhythm, normal heart sounds and intact distal pulses.   Pulmonary/Chest: WOB normal and breath sounds without rales or wheezing  Abdominal: Soft. Bowel sounds are normal. NT. No HSM  Musculoskeletal: Normal range of motion. Exhibits no edema Lymphadenopathy: Has no other cervical adenopathy.  Neurological: Pt is alert and oriented to person, place, and time. Pt has normal reflexes. No cranial nerve deficit. Motor grossly intact, Gait intact Skin: Skin is warm and dry. No rash noted or new ulcerations Psychiatric:  Has normal mood and affect. Behavior is normal without agitation No other exam findings  ECG today I have personally interpreted Sinus  Rhythm  - WNL    Assessment & Plan:

## 2016-11-10 NOTE — Progress Notes (Signed)
Pre visit review using our clinic review tool, if applicable. No additional management support is needed unless otherwise documented below in the visit note. 

## 2016-11-11 ENCOUNTER — Telehealth: Payer: Self-pay | Admitting: Internal Medicine

## 2016-11-11 ENCOUNTER — Encounter: Payer: Self-pay | Admitting: Internal Medicine

## 2016-11-11 ENCOUNTER — Other Ambulatory Visit (INDEPENDENT_AMBULATORY_CARE_PROVIDER_SITE_OTHER): Payer: Managed Care, Other (non HMO)

## 2016-11-11 DIAGNOSIS — Z0000 Encounter for general adult medical examination without abnormal findings: Secondary | ICD-10-CM

## 2016-11-11 LAB — HEPATIC FUNCTION PANEL
ALBUMIN: 3.8 g/dL (ref 3.5–5.2)
ALT: 17 U/L (ref 0–35)
AST: 15 U/L (ref 0–37)
Alkaline Phosphatase: 64 U/L (ref 39–117)
Bilirubin, Direct: 0.1 mg/dL (ref 0.0–0.3)
Total Bilirubin: 0.4 mg/dL (ref 0.2–1.2)
Total Protein: 6.8 g/dL (ref 6.0–8.3)

## 2016-11-11 LAB — CBC WITH DIFFERENTIAL/PLATELET
Basophils Absolute: 0 10*3/uL (ref 0.0–0.1)
Basophils Relative: 0.6 % (ref 0.0–3.0)
EOS ABS: 0.2 10*3/uL (ref 0.0–0.7)
Eosinophils Relative: 3.3 % (ref 0.0–5.0)
HCT: 41 % (ref 36.0–46.0)
HEMOGLOBIN: 14.2 g/dL (ref 12.0–15.0)
Lymphocytes Relative: 40.6 % (ref 12.0–46.0)
Lymphs Abs: 2.4 10*3/uL (ref 0.7–4.0)
MCHC: 34.6 g/dL (ref 30.0–36.0)
MCV: 88.3 fl (ref 78.0–100.0)
MONO ABS: 0.6 10*3/uL (ref 0.1–1.0)
Monocytes Relative: 10.4 % (ref 3.0–12.0)
Neutro Abs: 2.6 10*3/uL (ref 1.4–7.7)
Neutrophils Relative %: 45.1 % (ref 43.0–77.0)
Platelets: 217 10*3/uL (ref 150.0–400.0)
RBC: 4.64 Mil/uL (ref 3.87–5.11)
RDW: 13 % (ref 11.5–15.5)
WBC: 5.8 10*3/uL (ref 4.0–10.5)

## 2016-11-11 LAB — URINALYSIS, ROUTINE W REFLEX MICROSCOPIC
BILIRUBIN URINE: NEGATIVE
Hgb urine dipstick: NEGATIVE
Ketones, ur: NEGATIVE
Leukocytes, UA: NEGATIVE
NITRITE: NEGATIVE
PH: 6 (ref 5.0–8.0)
Specific Gravity, Urine: 1.025 (ref 1.000–1.030)
TOTAL PROTEIN, URINE-UPE24: NEGATIVE
UROBILINOGEN UA: 0.2 (ref 0.0–1.0)
Urine Glucose: NEGATIVE

## 2016-11-11 LAB — BASIC METABOLIC PANEL
BUN: 11 mg/dL (ref 6–23)
CO2: 28 mEq/L (ref 19–32)
CREATININE: 1.03 mg/dL (ref 0.40–1.20)
Calcium: 9.1 mg/dL (ref 8.4–10.5)
Chloride: 107 mEq/L (ref 96–112)
GFR: 74.03 mL/min (ref >60.00)
GLUCOSE: 99 mg/dL (ref 70–99)
POTASSIUM: 4.5 meq/L (ref 3.5–5.1)
Sodium: 140 mEq/L (ref 135–145)

## 2016-11-11 LAB — LIPID PANEL
CHOLESTEROL: 163 mg/dL (ref 0–200)
HDL: 46.3 mg/dL (ref >39.00)
LDL CALC: 101 mg/dL — AB (ref 0–99)
NonHDL: 116.4
TRIGLYCERIDES: 79 mg/dL (ref 0.0–149.0)
Total CHOL/HDL Ratio: 4
VLDL: 15.8 mg/dL (ref 0.0–40.0)

## 2016-11-11 LAB — HIV ANTIBODY (ROUTINE TESTING W REFLEX): HIV 1&2 Ab, 4th Generation: NONREACTIVE

## 2016-11-11 LAB — TSH: TSH: 1.17 u[IU]/mL (ref 0.35–4.50)

## 2016-11-11 NOTE — Telephone Encounter (Signed)
Pt needs a letter written by PCP stating her name, DOB, diagnosis/treatment, compliance and clearance for her to drive again regarding her hypertension. Needs to be on letterhead.   Fax to [phone]  Mail pt copy of letter as well.

## 2016-11-11 NOTE — Telephone Encounter (Signed)
Please call back regarding paper work. She could not explain it to me.

## 2016-11-12 ENCOUNTER — Encounter: Payer: Self-pay | Admitting: Internal Medicine

## 2016-11-12 NOTE — Telephone Encounter (Signed)
Faxed

## 2016-11-12 NOTE — Telephone Encounter (Signed)
Done hardcopy to Shirron  

## 2016-11-14 NOTE — Assessment & Plan Note (Signed)

## 2016-11-15 ENCOUNTER — Telehealth: Payer: Self-pay | Admitting: Internal Medicine

## 2016-11-15 NOTE — Telephone Encounter (Signed)
Patient called in, gave MD response on recent labs.

## 2016-12-23 ENCOUNTER — Telehealth: Payer: Self-pay | Admitting: Internal Medicine

## 2016-12-23 NOTE — Telephone Encounter (Signed)
Pt has been informed and scheduled an appt for tomorrow.

## 2016-12-23 NOTE — Telephone Encounter (Signed)
I would have to agree, as congestion can be related to other things such as allergies that would not require an antibiotic. Please consider OV

## 2016-12-23 NOTE — Telephone Encounter (Signed)
Spoke with patient and she stated that she has had a cold since last week. She wanted to have an antibiotic called in but I explained to her with antibiotics an OV is required. She then responded that she will not come in since she was here last month and proceed to ask if you could call her in something for a cough that would have an lasting effect. I told patient that I would be more than happy to ask the provider but I did reiterate since she has not been evaluated for a cold or cough I don't believe anything could be sent to the pharmacy unless she has been seen. Please advise.

## 2016-12-23 NOTE — Telephone Encounter (Signed)
Pt would like a call back regarding her cold.

## 2016-12-24 ENCOUNTER — Encounter: Payer: Self-pay | Admitting: Internal Medicine

## 2016-12-24 ENCOUNTER — Ambulatory Visit (INDEPENDENT_AMBULATORY_CARE_PROVIDER_SITE_OTHER): Payer: Managed Care, Other (non HMO) | Admitting: Internal Medicine

## 2016-12-24 VITALS — BP 126/84 | HR 78 | Temp 98.2°F | Ht 62.0 in | Wt 243.0 lb

## 2016-12-24 DIAGNOSIS — M25572 Pain in left ankle and joints of left foot: Secondary | ICD-10-CM

## 2016-12-24 DIAGNOSIS — I10 Essential (primary) hypertension: Secondary | ICD-10-CM

## 2016-12-24 DIAGNOSIS — M25571 Pain in right ankle and joints of right foot: Secondary | ICD-10-CM | POA: Diagnosis not present

## 2016-12-24 DIAGNOSIS — J019 Acute sinusitis, unspecified: Secondary | ICD-10-CM

## 2016-12-24 MED ORDER — AZITHROMYCIN 250 MG PO TABS: ORAL_TABLET | ORAL | 1 refills | Status: DC

## 2016-12-24 MED ORDER — HYDROCODONE-HOMATROPINE 5-1.5 MG/5ML PO SYRP: 5.0000 mL | ORAL_SOLUTION | Freq: Four times a day (QID) | ORAL | 0 refills | Status: DC | PRN

## 2016-12-24 MED ORDER — NABUMETONE 500 MG PO TABS: 500.0000 mg | ORAL_TABLET | Freq: Two times a day (BID) | ORAL | 5 refills | Status: DC | PRN

## 2016-12-24 MED ORDER — BENZONATATE 100 MG PO CAPS: ORAL_CAPSULE | ORAL | 1 refills | Status: DC

## 2016-12-24 NOTE — Patient Instructions (Addendum)
Please take all new medication as prescribed - the antibiotic, and cough medicine if needed, and the anti-inflammatory for pain if needed as well  You can also take Delsym OTC for cough, and/or Mucinex (or it's generic off brand) for congestion, and tylenol as needed for pain.  Please continue all other medications as before, and refills have been done if requested.  Please have the pharmacy call with any other refills you may need.  Please keep your appointments with your specialists as you may have planned

## 2016-12-24 NOTE — Progress Notes (Signed)
Subjective:    Patient ID: [name], female    DOB: 09/09/1970, 46 y.o.   MRN: [id number]  HPI   Here with 2-3 days acute onset fever, facial pain, pressure, headache, general weakness and malaise, and greenish d/c, with mild ST and cough, but pt denies chest pain, wheezing, increased sob or doe, orthopnea, PND, increased LE swelling, palpitations, dizziness or syncope.  Right face is swollen as well per husband. Has bilat ankle pain recurrent without swelling, worse to take first few steps after sitting, no giveaways or falls. Seems to last for a few days then resolves but not sure why, just keeps doing this.  Pt denies new neurological symptoms such as new headache, or facial or extremity weakness or numbness   Pt denies polydipsia, polyuria Past Medical History:  Diagnosis Date  . ABDOMINAL TENDERNESS 06/10/2010  . ANEMIA-IRON DEFICIENCY 06/10/2010  . CHEST PAIN 06/10/2010  . HYPERTENSION 06/10/2010  . MENORRHALGIA 06/10/2010  . NEPHROLITHIASIS, HX OF 06/10/2010   Past Surgical History:  Procedure Laterality Date  . CHOLECYSTECTOMY    . TOTAL ABDOMINAL HYSTERECTOMY      reports that she has never smoked. She has never used smokeless tobacco. She reports that she drinks alcohol. She reports that she does not use drugs. family history includes Diabetes in her other; Hypertension in her other, other, and other; Stroke in her other and other. No Known Allergies Current Outpatient Prescriptions on File Prior to Visit  Medication Sig Dispense Refill  . amLODipine-benazepril (LOTREL) 10-20 MG capsule TAKE ONE CAPSULE BY MOUTH DAILY 30 capsule 10  . aspirin 81 MG EC tablet Take 81 mg by mouth daily.      . diclofenac sodium (VOLTAREN) 1 % GEL Apply 4 g topically 4 (four) times daily as needed. 400 g 1  . ibuprofen (ADVIL,MOTRIN) 800 MG tablet Take 1 tablet (800 mg total) by mouth every 8 (eight) hours as needed. 60 tablet 1   No current facility-administered medications on file prior to  visit.    Review of Systems  Constitutional: Negative for other unusual diaphoresis or sweats HENT: Negative for ear discharge or swelling Eyes: Negative for other worsening visual disturbances Respiratory: Negative for stridor or other swelling  Gastrointestinal: Negative for worsening distension or other blood Genitourinary: Negative for retention or other urinary change Musculoskeletal: Negative for other MSK pain or swelling Skin: Negative for color change or other new lesions Neurological: Negative for worsening tremors and other numbness  Psychiatric/Behavioral: Negative for worsening agitation or other fatigue All other system neg per pt    Objective:   Physical Exam BP 126/84 (BP Location: Left Arm, Patient Position: Sitting, Cuff Size: Large)   Pulse 78   Temp 98.2 F (36.8 C) (Oral)   Ht 5\' 2"  (1.575 m)   Wt 243 lb (110.2 kg)   SpO2 99%   BMI 44.45 kg/m  VS noted, mild ill Constitutional: Pt appears in NAD HENT: Head: NCAT.  Right Ear: External ear normal.  Left Ear: External ear normal.  Bilat tm's with mild erythema.  Right Max sinus areas mild tender.  Pharynx with mild erythema, no exudate Eyes: . Pupils are equal, round, and reactive to light. Conjunctivae and EOM are normal Nose: without d/c or deformity Neck: Neck supple. Gross normal ROM Cardiovascular: Normal rate and regular rhythm.   Bilat ankle with ? Mild bony degenerative change, NT, FROM, no effusion Pulmonary/Chest: Effort normal and breath sounds without rales or wheezing.  Neurological: Pt is  alert. At baseline orientation, motor grossly intact Skin: Skin is warm. No rashes, other new lesions, no LE edema Psychiatric: Pt behavior is normal without agitation  No other exam findings    Assessment & Plan:

## 2016-12-24 NOTE — Assessment & Plan Note (Signed)
stable overall by history and exam, recent data reviewed with pt, and pt to continue medical treatment as before,  to f/u any worsening symptoms or concerns BP Readings from Last 3 Encounters:  12/24/16 126/84  11/10/16 128/80  06/16/16 (!) 142/82

## 2016-12-24 NOTE — Assessment & Plan Note (Signed)
Mild to mod, for antibx course,  to f/u any worsening symptoms or concerns 

## 2016-12-24 NOTE — Assessment & Plan Note (Signed)
Suspect underlying mild djd vs other, for relafen bid prn,  to f/u any worsening symptoms or concerns

## 2017-01-08 ENCOUNTER — Other Ambulatory Visit: Payer: Self-pay | Admitting: Internal Medicine

## 2017-10-03 IMAGING — DX DG KNEE COMPLETE 4+V*R*
4 series · 4 of 4 positions shown · non-contrast
Comparison: None.

CLINICAL DATA: Twisting injury of the leg 7 days ago.  No swelling.

EXAM:
RIGHT KNEE - COMPLETE 4+ VIEW

[knee ap]
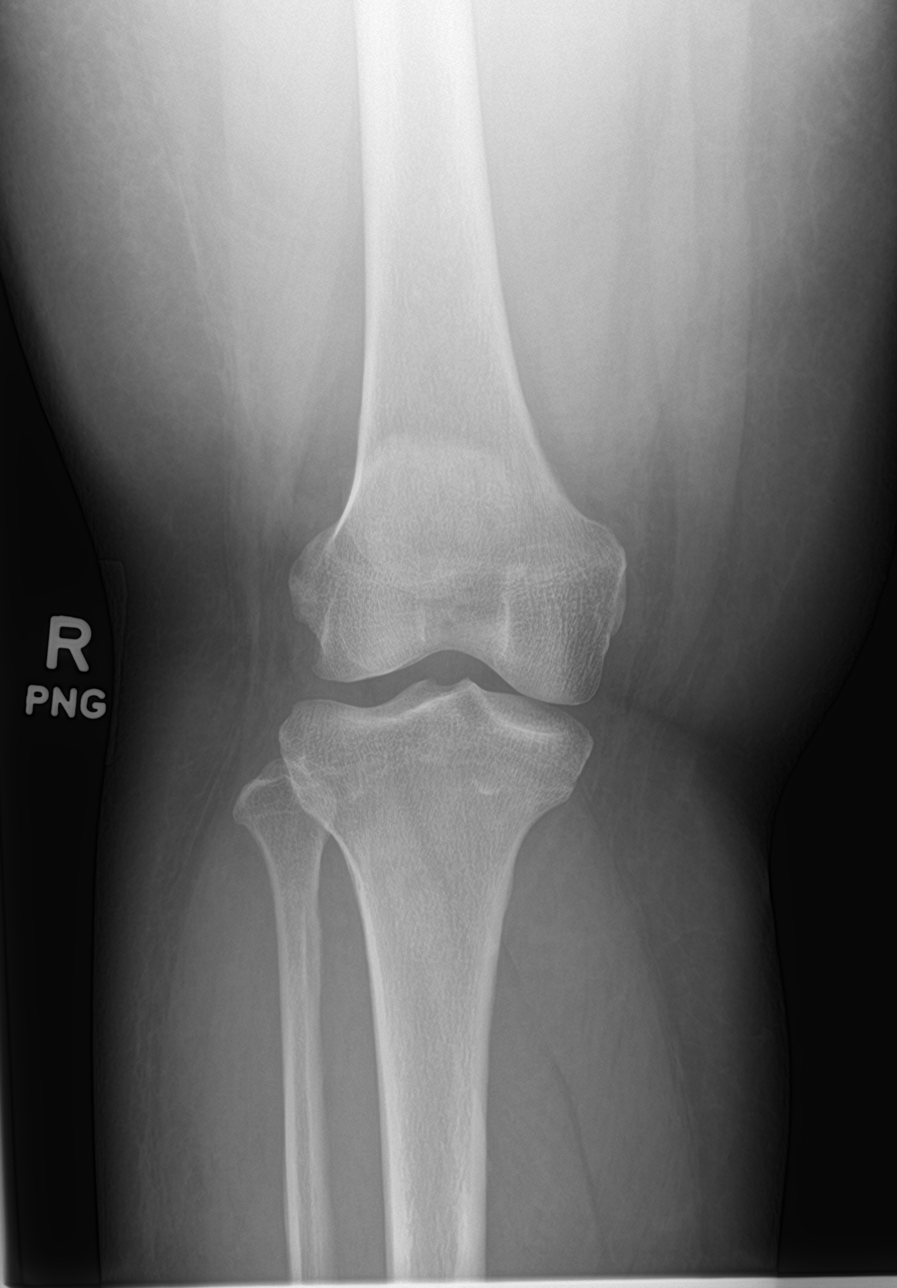

[knee obl (1 of 2)]
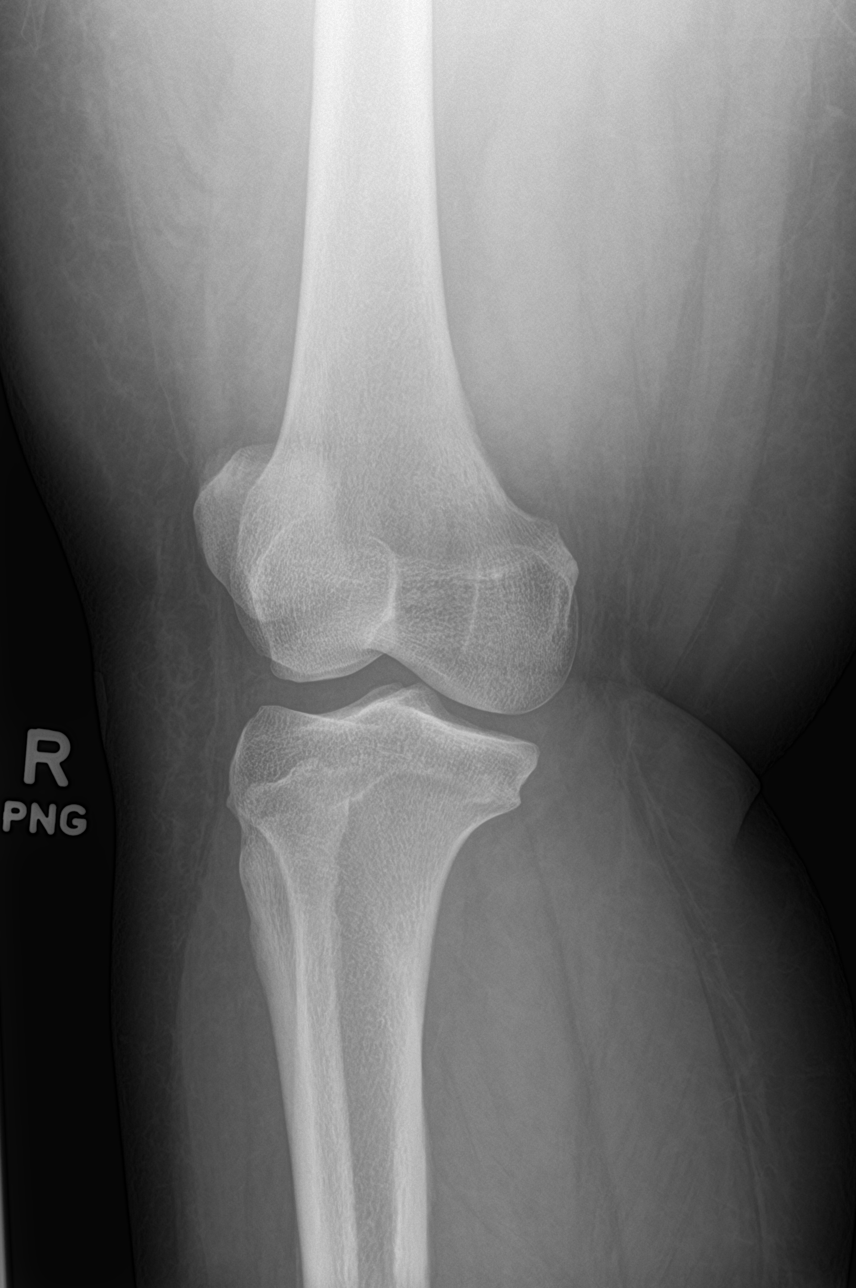

[knee obl (2 of 2)]
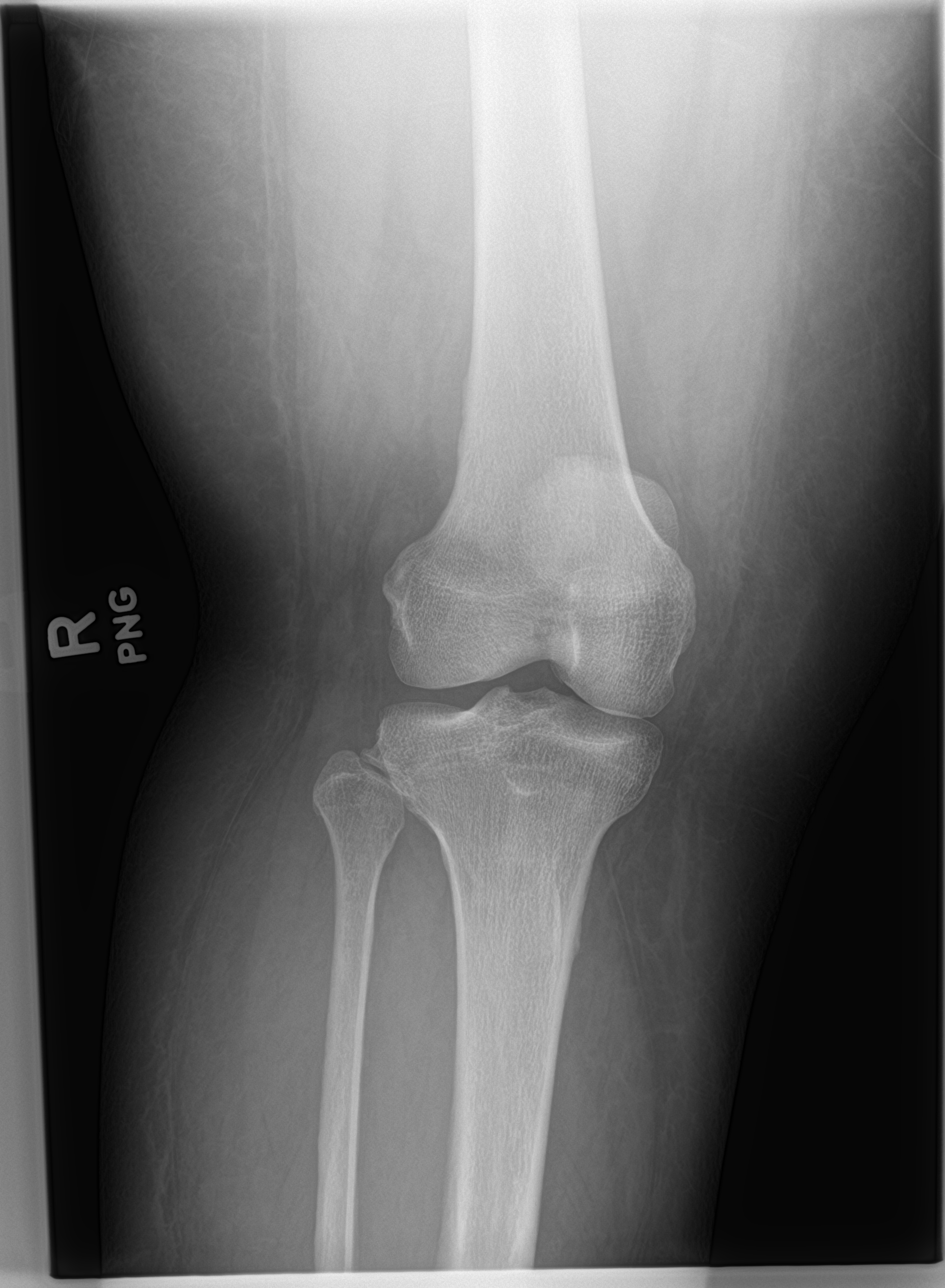

[knee lat]
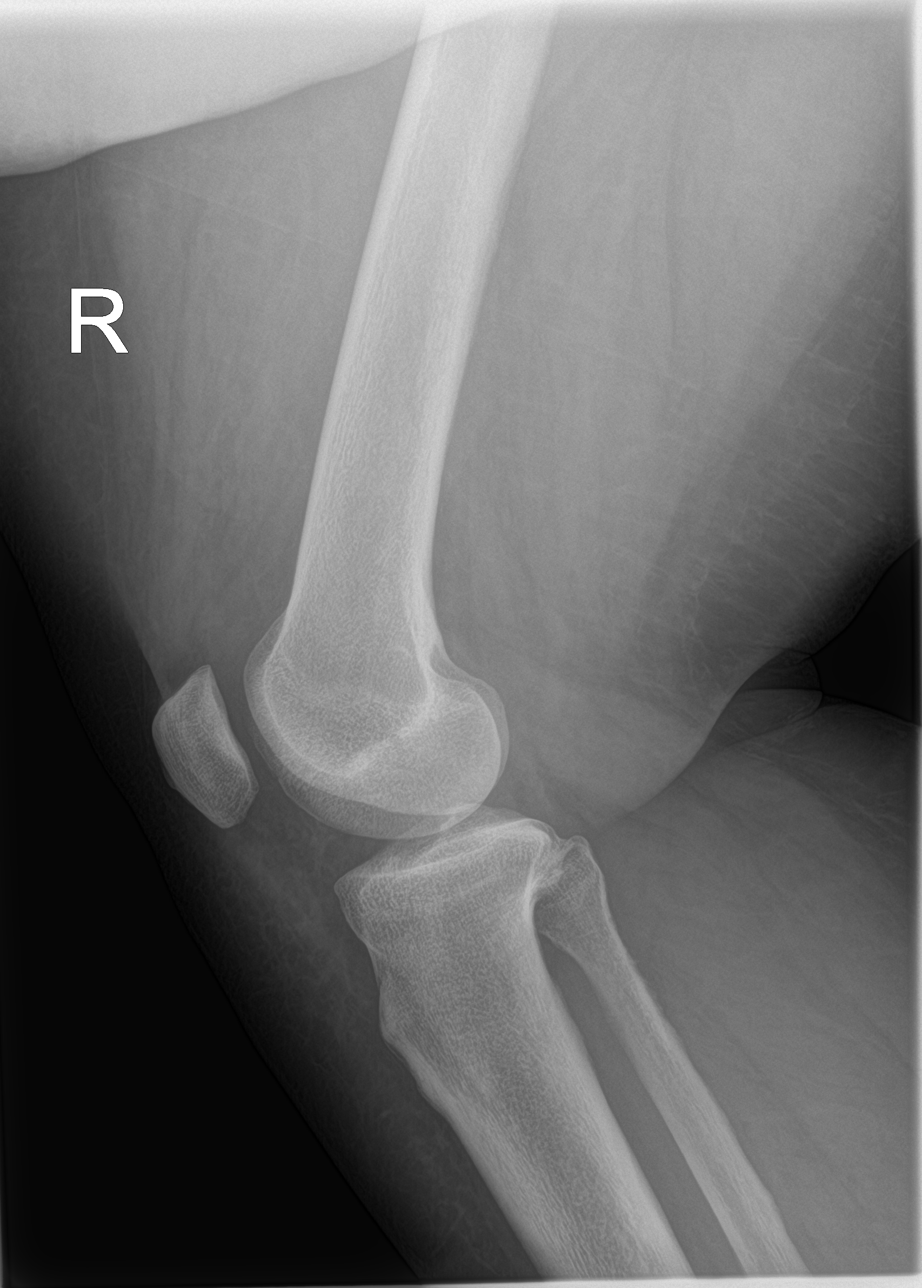

[4 of 4 positions shown; findings below may reference images not displayed]

FINDINGS: No evidence of fracture, dislocation, or joint effusion. No evidence
of arthropathy or other focal bone abnormality. Soft tissues are
unremarkable.
IMPRESSION: No acute osseous injury of the right knee.

## 2017-10-03 IMAGING — DX DG ANKLE COMPLETE 3+V*R*
2 series · 2 of 2 positions shown · non-contrast
Comparison: None.

CLINICAL DATA: Acute right ankle pain after twisting injury.
Initial encounter.

EXAM:
RIGHT ANKLE - COMPLETE 3+ VIEW

[ankle ap]
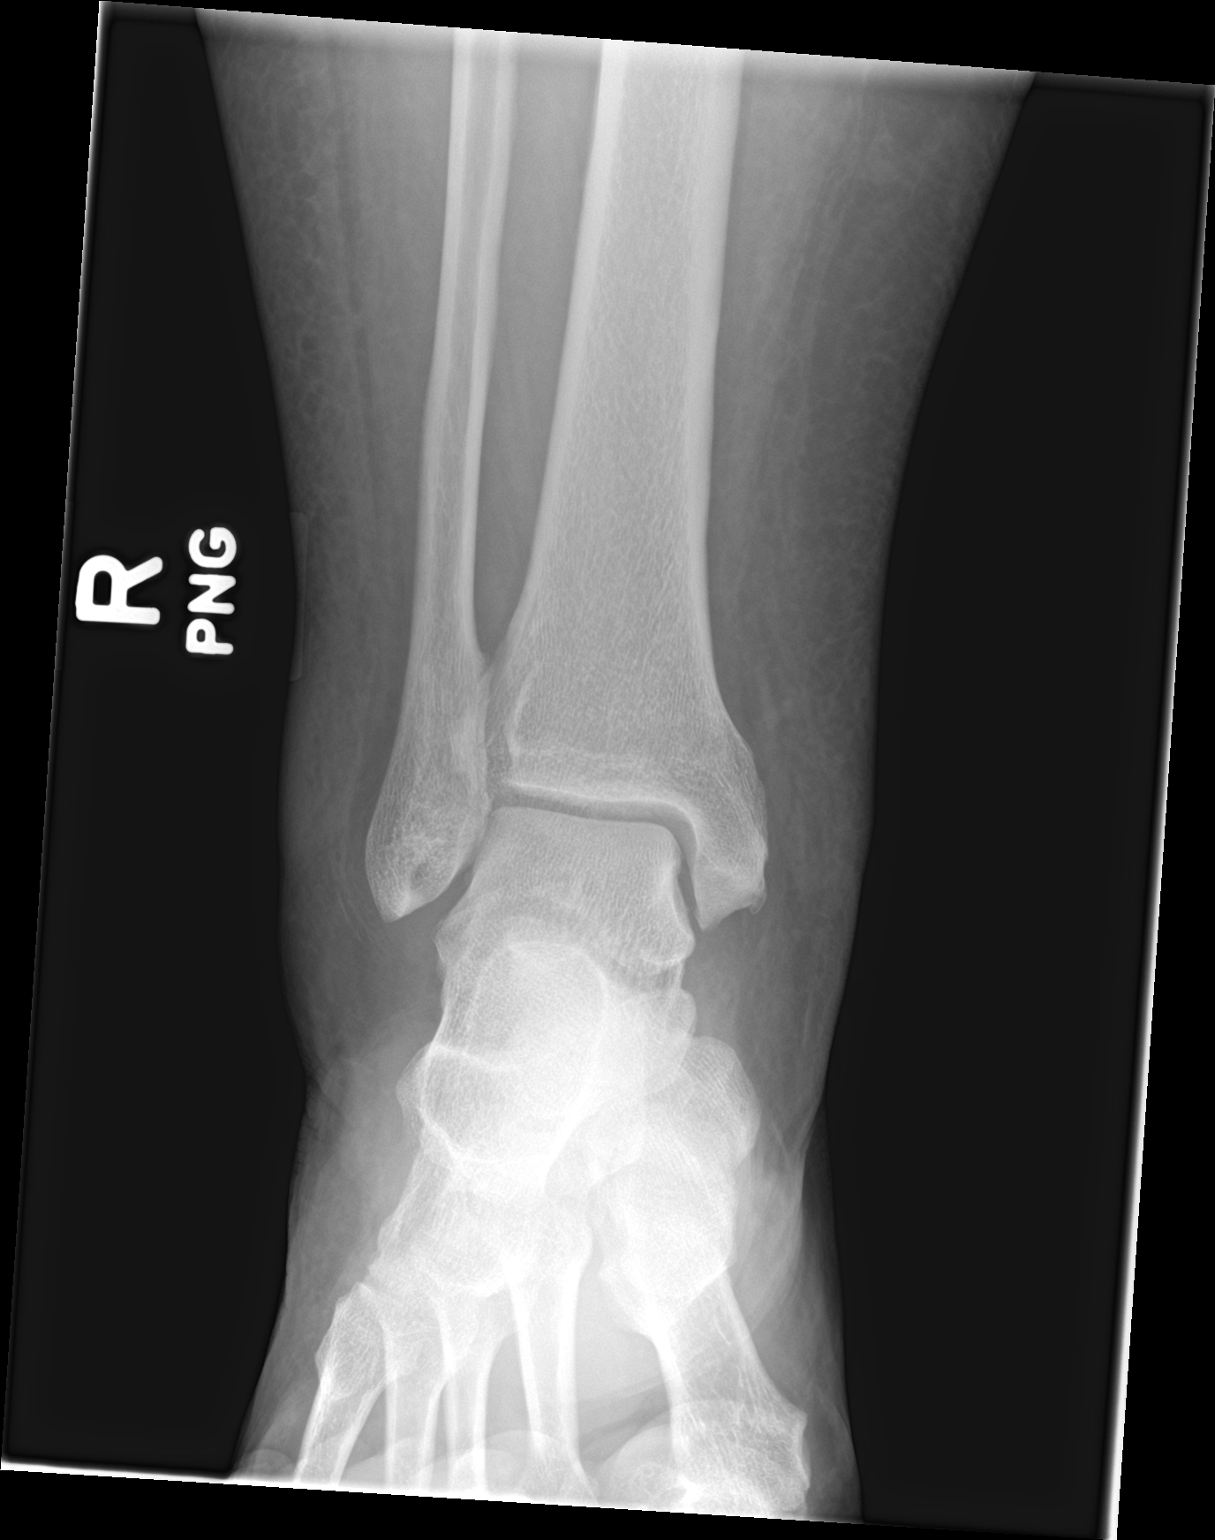

[ankle obl]
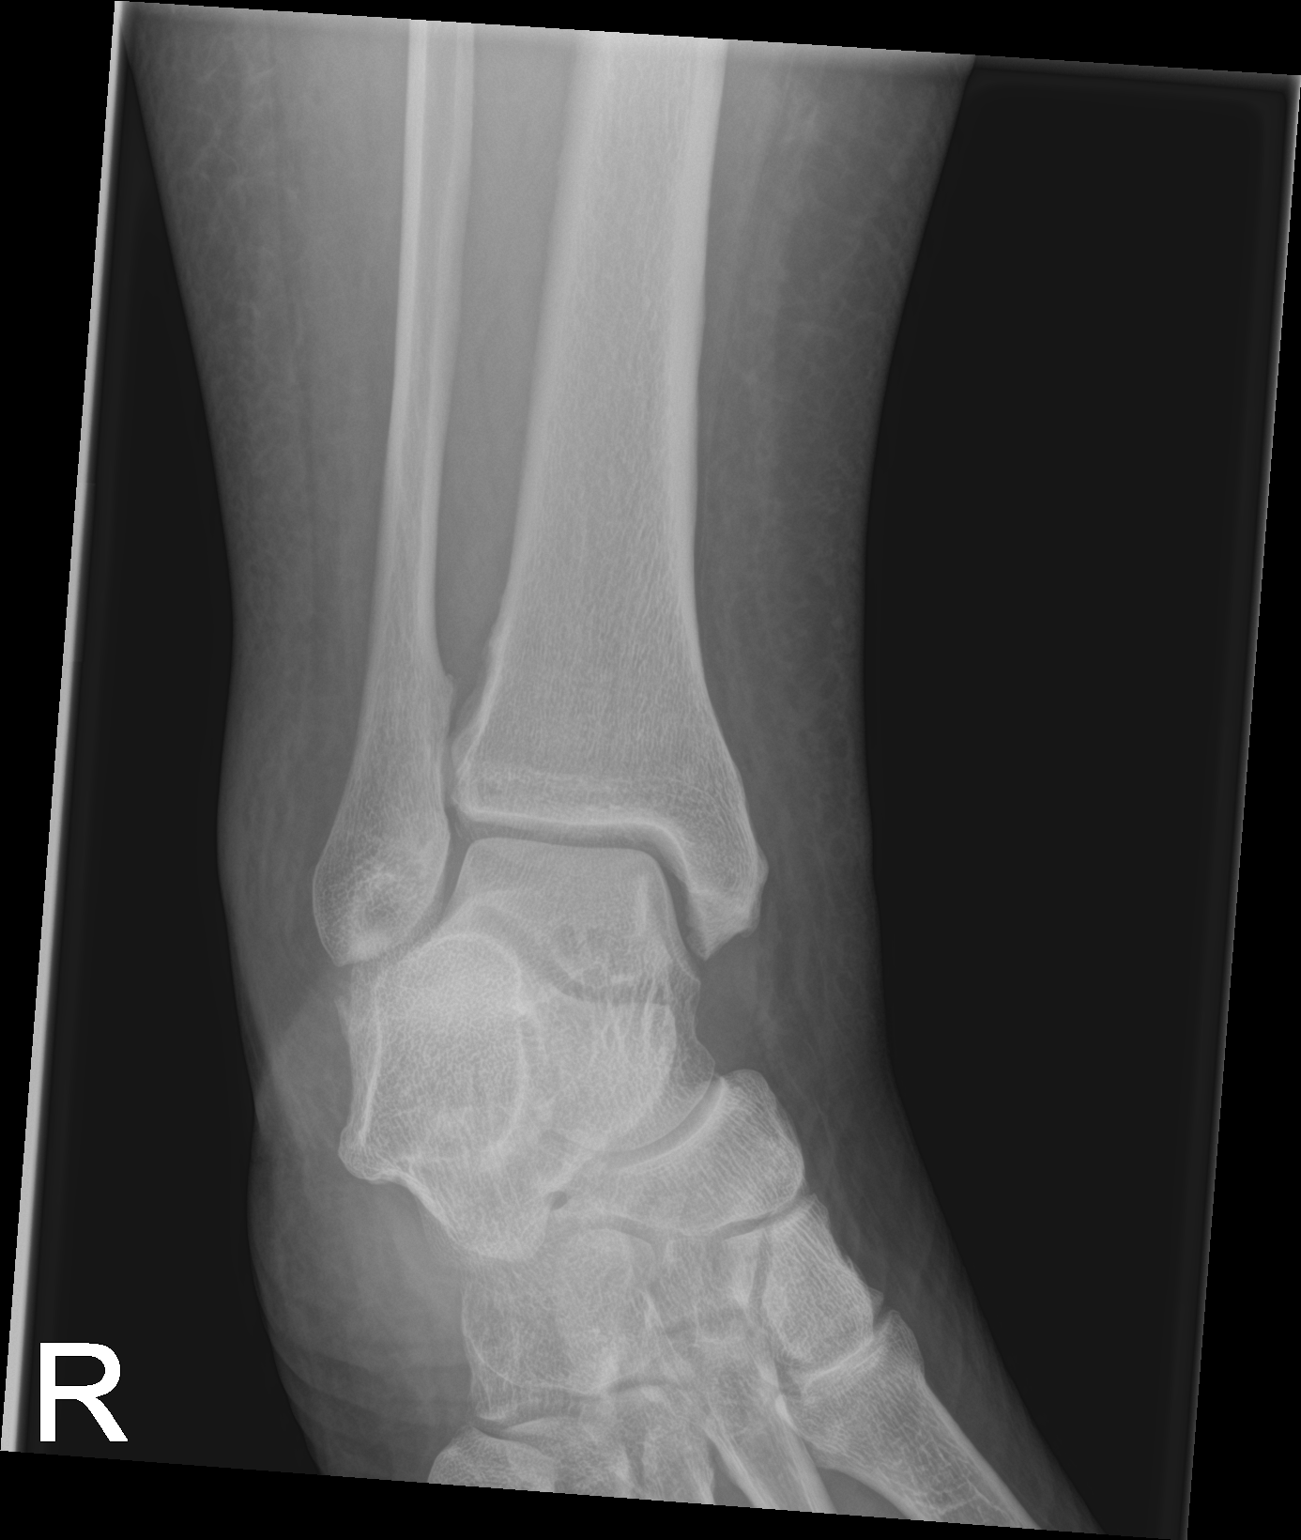

[2 of 2 positions shown; findings below may reference images not displayed]

FINDINGS: Soft tissue swelling with subcutaneous reticulation. No fracture
deformity or subluxation. No ankle joint effusion.
IMPRESSION: Soft tissue swelling without fracture.

## 2017-11-08 ENCOUNTER — Other Ambulatory Visit (INDEPENDENT_AMBULATORY_CARE_PROVIDER_SITE_OTHER): Payer: Managed Care, Other (non HMO)

## 2017-11-08 DIAGNOSIS — Z0000 Encounter for general adult medical examination without abnormal findings: Secondary | ICD-10-CM

## 2017-11-08 LAB — CBC WITH DIFFERENTIAL/PLATELET
Basophils Absolute: 0.1 10*3/uL (ref 0.0–0.1)
Basophils Relative: 1.3 % (ref 0.0–3.0)
EOS ABS: 0.2 10*3/uL (ref 0.0–0.7)
Eosinophils Relative: 3.1 % (ref 0.0–5.0)
HEMATOCRIT: 41.7 % (ref 36.0–46.0)
Hemoglobin: 14.4 g/dL (ref 12.0–15.0)
LYMPHS ABS: 1.9 10*3/uL (ref 0.7–4.0)
LYMPHS PCT: 35.6 % (ref 12.0–46.0)
MCHC: 34.4 g/dL (ref 30.0–36.0)
MCV: 87.9 fl (ref 78.0–100.0)
Monocytes Absolute: 0.5 10*3/uL (ref 0.1–1.0)
Monocytes Relative: 10.3 % (ref 3.0–12.0)
NEUTROS PCT: 49.7 % (ref 43.0–77.0)
Neutro Abs: 2.6 10*3/uL (ref 1.4–7.7)
PLATELETS: 234 10*3/uL (ref 150.0–400.0)
RBC: 4.75 Mil/uL (ref 3.87–5.11)
RDW: 13.6 % (ref 11.5–15.5)
WBC: 5.3 10*3/uL (ref 4.0–10.5)

## 2017-11-08 LAB — URINALYSIS, ROUTINE W REFLEX MICROSCOPIC
Bilirubin Urine: NEGATIVE
Hgb urine dipstick: NEGATIVE
Ketones, ur: NEGATIVE
Leukocytes, UA: NEGATIVE
Nitrite: POSITIVE — AB
PH: 6 (ref 5.0–8.0)
RBC / HPF: NONE SEEN (ref 0–?)
SPECIFIC GRAVITY, URINE: 1.025 (ref 1.000–1.030)
TOTAL PROTEIN, URINE-UPE24: NEGATIVE
URINE GLUCOSE: NEGATIVE
Urobilinogen, UA: 1 (ref 0.0–1.0)

## 2017-11-08 LAB — BASIC METABOLIC PANEL
BUN: 9 mg/dL (ref 6–23)
CHLORIDE: 106 meq/L (ref 96–112)
CO2: 26 meq/L (ref 19–32)
Calcium: 9.3 mg/dL (ref 8.4–10.5)
Creatinine, Ser: 0.86 mg/dL (ref 0.40–1.20)
GFR: 90.77 mL/min (ref >60.00)
Glucose, Bld: 93 mg/dL (ref 70–99)
Potassium: 4.2 mEq/L (ref 3.5–5.1)
Sodium: 139 mEq/L (ref 135–145)

## 2017-11-08 LAB — HEPATIC FUNCTION PANEL
ALK PHOS: 63 U/L (ref 39–117)
ALT: 29 U/L (ref 0–35)
AST: 22 U/L (ref 0–37)
Albumin: 3.9 g/dL (ref 3.5–5.2)
BILIRUBIN DIRECT: 0.1 mg/dL (ref 0.0–0.3)
Total Bilirubin: 0.6 mg/dL (ref 0.2–1.2)
Total Protein: 7.1 g/dL (ref 6.0–8.3)

## 2017-11-08 LAB — LIPID PANEL
CHOL/HDL RATIO: 3
Cholesterol: 168 mg/dL (ref 0–200)
HDL: 49 mg/dL (ref >39.00)
LDL CALC: 109 mg/dL — AB (ref 0–99)
NONHDL: 119.32
Triglycerides: 54 mg/dL (ref 0.0–149.0)
VLDL: 10.8 mg/dL (ref 0.0–40.0)

## 2017-11-08 LAB — TSH: TSH: 0.75 u[IU]/mL (ref 0.35–4.50)

## 2017-11-10 ENCOUNTER — Ambulatory Visit: Payer: Managed Care, Other (non HMO) | Admitting: Internal Medicine

## 2017-11-11 ENCOUNTER — Ambulatory Visit (INDEPENDENT_AMBULATORY_CARE_PROVIDER_SITE_OTHER): Payer: Managed Care, Other (non HMO) | Admitting: Internal Medicine

## 2017-11-11 ENCOUNTER — Encounter: Payer: Self-pay | Admitting: Internal Medicine

## 2017-11-11 VITALS — BP 138/88 | HR 90 | Temp 98.3°F | Ht 62.0 in | Wt 244.0 lb

## 2017-11-11 DIAGNOSIS — Z0000 Encounter for general adult medical examination without abnormal findings: Secondary | ICD-10-CM

## 2017-11-11 DIAGNOSIS — E785 Hyperlipidemia, unspecified: Secondary | ICD-10-CM | POA: Diagnosis not present

## 2017-11-11 DIAGNOSIS — I10 Essential (primary) hypertension: Secondary | ICD-10-CM

## 2017-11-11 DIAGNOSIS — R739 Hyperglycemia, unspecified: Secondary | ICD-10-CM | POA: Insufficient documentation

## 2017-11-11 MED ORDER — AMLODIPINE BESY-BENAZEPRIL HCL 10-20 MG PO CAPS: 1.0000 | ORAL_CAPSULE | Freq: Every day | ORAL | 3 refills | Status: DC

## 2017-11-11 MED ORDER — IBUPROFEN 800 MG PO TABS: 800.0000 mg | ORAL_TABLET | Freq: Three times a day (TID) | ORAL | 1 refills | Status: DC | PRN

## 2017-11-11 NOTE — Progress Notes (Signed)
Subjective:    Patient ID: [name], female    DOB: [birth year], 47 y.o.   MRN: [id number]  HPI  Here for wellness and f/u;  Overall doing ok;  Pt denies Chest pain, worsening SOB, DOE, wheezing, orthopnea, PND, worsening LE edema, palpitations, dizziness or syncope.  Pt denies neurological change such as new headache, facial or extremity weakness.  Pt denies polydipsia, polyuria, or low sugar symptoms. Pt states overall good compliance with treatment and medications, good tolerability, and has been trying to follow appropriate diet.  Pt denies worsening depressive symptoms, suicidal ideation or panic. No fever, night sweats, wt loss, loss of appetite, or other constitutional symptoms.  Pt states good ability with ADL's, has low fall risk, home safety reviewed and adequate, no other significant changes in hearing or vision, and only occasionally active with exercise.  Declines statin for HLD.  No other interval hx or new complaints Past Medical History:  Diagnosis Date  . ABDOMINAL TENDERNESS 06/10/2010  . ANEMIA-IRON DEFICIENCY 06/10/2010  . CHEST PAIN 06/10/2010  . HYPERTENSION 06/10/2010  . MENORRHALGIA 06/10/2010  . NEPHROLITHIASIS, HX OF 06/10/2010   Past Surgical History:  Procedure Laterality Date  . CHOLECYSTECTOMY    . TOTAL ABDOMINAL HYSTERECTOMY      reports that she has never smoked. She has never used smokeless tobacco. She reports that she drinks alcohol. She reports that she does not use drugs. family history includes Diabetes in her other; Hypertension in her other, other, and other; Stroke in her other and other. No Known Allergies Current Outpatient Medications on File Prior to Visit  Medication Sig Dispense Refill  . aspirin 81 MG EC tablet Take 81 mg by mouth daily.       No current facility-administered medications on file prior to visit.    Review of Systems Constitutional: Negative for other unusual diaphoresis, sweats, appetite or weight changes HENT: Negative  for other worsening hearing loss, ear pain, facial swelling, mouth sores or neck stiffness.   Eyes: Negative for other worsening pain, redness or other visual disturbance.  Respiratory: Negative for other stridor or swelling Cardiovascular: Negative for other palpitations or other chest pain  Gastrointestinal: Negative for worsening diarrhea or loose stools, blood in stool, distention or other pain Genitourinary: Negative for hematuria, flank pain or other change in urine volume.  Musculoskeletal: Negative for myalgias or other joint swelling.  Skin: Negative for other color change, or other wound or worsening drainage.  Neurological: Negative for other syncope or numbness. Hematological: Negative for other adenopathy or swelling Psychiatric/Behavioral: Negative for hallucinations, other worsening agitation, SI, self-injury, or new decreased concentration All other system neg per pt    Objective:   Physical Exam BP 138/88   Pulse 90   Temp 98.3 F (36.8 C) (Oral)   Ht 5\' 2"  (1.575 m)   Wt 244 lb (110.7 kg)   SpO2 97%   BMI 44.63 kg/m  VS noted,  Constitutional: Pt is oriented to person, place, and time. Appears well-developed and well-nourished, in no significant distress and comfortable Head: Normocephalic and atraumatic  Eyes: Conjunctivae and EOM are normal. Pupils are equal, round, and reactive to light Right Ear: External ear normal without discharge Left Ear: External ear normal without discharge Nose: Nose without discharge or deformity Mouth/Throat: Oropharynx is without other ulcerations and moist  Neck: Normal range of motion. Neck supple. No JVD present. No tracheal deviation present or significant neck LA or mass Cardiovascular: Normal rate, regular rhythm, normal heart sounds  and intact distal pulses.   Pulmonary/Chest: WOB normal and breath sounds without rales or wheezing  Abdominal: Soft. Bowel sounds are normal. NT. No HSM  Musculoskeletal: Normal range of motion.  Exhibits no edema Lymphadenopathy: Has no other cervical adenopathy.  Neurological: Pt is alert and oriented to person, place, and time. Pt has normal reflexes. No cranial nerve deficit. Motor grossly intact, Gait intact Skin: Skin is warm and dry. No rash noted or new ulcerations Psychiatric:  Has normal mood and affect. Behavior is normal without agitation No other exam findings Lab Results  Component Value Date   WBC 5.3 11/08/2017   HGB 14.4 11/08/2017   HCT 41.7 11/08/2017   PLT 234.0 11/08/2017   GLUCOSE 93 11/08/2017   CHOL 168 11/08/2017   TRIG 54.0 11/08/2017   HDL 49.00 11/08/2017   LDLCALC 109 (H) 11/08/2017   ALT 29 11/08/2017   AST 22 11/08/2017   NA 139 11/08/2017   K 4.2 11/08/2017   CL 106 11/08/2017   CREATININE 0.86 11/08/2017   BUN 9 11/08/2017   CO2 26 11/08/2017   TSH 0.75 11/08/2017      Assessment & Plan:

## 2017-11-11 NOTE — Patient Instructions (Signed)
Please continue all other medications as before, and refills have been done if requested.  Please have the pharmacy call with any other refills you may need.  Please continue your efforts at being more active, low cholesterol diet, and weight control.  You are otherwise up to date with prevention measures today.  Please keep your appointments with your specialists as you may have planned  Please return in 1 year for your yearly visit, or sooner if needed, with Lab testing done 3-5 days before  

## 2017-11-12 NOTE — Assessment & Plan Note (Signed)
stable overall by history and exam, recent data reviewed with pt, and pt to continue medical treatment as before,  to f/u any worsening symptoms or concerns  

## 2017-11-12 NOTE — Assessment & Plan Note (Signed)
stable overall by history and exam, recent data reviewed with pt, and pt to continue medical treatment as before,  to f/u any worsening symptoms or concerns BP Readings from Last 3 Encounters:  11/11/17 138/88  12/24/16 126/84  11/10/16 128/80

## 2017-11-12 NOTE — Assessment & Plan Note (Signed)

## 2017-11-12 NOTE — Assessment & Plan Note (Signed)
declines statin for now, to cont low chol diet

## 2018-06-17 ENCOUNTER — Encounter: Payer: Self-pay | Admitting: Family Medicine

## 2018-06-17 ENCOUNTER — Ambulatory Visit: Payer: Managed Care, Other (non HMO) | Admitting: Family Medicine

## 2018-06-17 VITALS — BP 134/92 | HR 80 | Temp 98.2°F | Resp 16 | Ht 62.0 in | Wt 249.0 lb

## 2018-06-17 DIAGNOSIS — J029 Acute pharyngitis, unspecified: Secondary | ICD-10-CM

## 2018-06-17 DIAGNOSIS — I10 Essential (primary) hypertension: Secondary | ICD-10-CM | POA: Diagnosis not present

## 2018-06-17 DIAGNOSIS — J020 Streptococcal pharyngitis: Secondary | ICD-10-CM | POA: Diagnosis not present

## 2018-06-17 LAB — POCT RAPID STREP A (OFFICE): Rapid Strep A Screen: POSITIVE — AB

## 2018-06-17 MED ORDER — PENICILLIN V POTASSIUM 500 MG PO TABS: 500.0000 mg | ORAL_TABLET | Freq: Three times a day (TID) | ORAL | 0 refills | Status: DC

## 2018-06-17 NOTE — Patient Instructions (Signed)
You have strep throat- sorry to tell you that!   Treat with penicillin 3x a day for 10 days.   See [place] back if not improving by Monday or if symptom worsen at any point during treatment.

## 2018-06-17 NOTE — Assessment & Plan Note (Signed)
S: controlled poorly on amlodipine-benazepril 10-20mg . She just took her medicine right before visit. Reports home BPs have been <140/90 BP Readings from Last 3 Encounters:  06/17/18 (!) 134/92  11/11/17 138/88  12/24/16 126/84  A/P: slight BP elevation today- discussed monitoring at home and following up if home #s do not remain well controlled. Continue current BP medicines for now

## 2018-06-17 NOTE — Progress Notes (Signed)
PCP: Biagio Borg, MD  Subjective:  [name] is a 47 y.o. year old very pleasant female patient who presents with  symptoms including sore throat, fatigue, elevated temperature, low appetite.  -started: yesterday, symptoms are worsening -previous treatments: has tried some halls without relief of symptoms.  -sick contacts/travel/risks: denies flu exposure. 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   ROS-denies chest pain, SOB, vomiting, tooth pain. Did have one episode of diarrhea  Pertinent Past Medical History-  Patient Active Problem List   Diagnosis Date Noted  . Hyperglycemia 11/11/2017  . HLD (hyperlipidemia) 11/11/2017  . Right knee pain 06/16/2016  . Bilateral ankle pain 06/16/2016  . Acute upper respiratory infection 11/06/2015  . Pain of finger of left hand 01/30/2013  . Acute sinusitis 10/09/2011  . Preventative health care 11/12/2010  . ANEMIA-IRON DEFICIENCY 06/10/2010  . Essential hypertension 06/10/2010  . NEPHROLITHIASIS, HX OF 06/10/2010    Medications- reviewed  Current Outpatient Medications  Medication Sig Dispense Refill  . amLODipine-benazepril (LOTREL) 10-20 MG capsule Take 1 capsule by mouth daily. 90 capsule 3  . aspirin 81 MG EC tablet Take 81 mg by mouth daily.      Marland Kitchen ibuprofen (ADVIL,MOTRIN) 800 MG tablet Take 1 tablet (800 mg total) by mouth every 8 (eight) hours as needed. 60 tablet 1   No current facility-administered medications for this visit.    Objective: BP (!) 134/92   Pulse 80   Temp 98.2 F (36.8 C) (Oral)   Resp 16   Ht 5\' 2"  (1.575 m)   Wt 249 lb (112.9 kg)   SpO2 98%   BMI 45.54 kg/m  Gen: NAD, appears fatigued HEENT: Turbinates erythematous, TM normal, pharynx markedly erythematous with minimal tonsilar exudate. noted tonsilar edema, no sinus tenderness CV: RRR no murmurs rubs or gallops Lungs: CTAB no crackles, wheeze, rhonchi Ext: no edema Skin: warm, dry, no  rash  Results for orders placed or performed in visit on 06/17/18 (from the past 24 hour(s))  POCT rapid strep A     Status: Abnormal   Collection Time: 06/17/18 10:29 AM  Result Value Ref Range   Rapid Strep A Screen Positive (A) Negative   Assessment/Plan:  Streptococcal pharyngitis History and exam today are suggestive of bacterial infection due to group A streptococcus.  Antibiotic: penicillin V 500 mg TID will be used Orally advised- Symptomatic treatment with: salt water gargles, can schedule tylenol 650 mg every 6 hours, sore throat lozenges  Advised contagious until at least 24 hours fever free and that treatment usually effective within 24-48 hours  Finally, we reviewed reasons to return to care including if symptoms worsen or persist or new concerns arise-particularly worsening pain or fever curve.   Essential hypertension S: controlled poorly on amlodipine-benazepril 10-20mg . She just took her medicine right before visit. Reports home BPs have been <140/90 BP Readings from Last 3 Encounters:  06/17/18 (!) 134/92  11/11/17 138/88  12/24/16 126/84  A/P: slight BP elevation today- discussed monitoring at home and following up if home #s do not remain well controlled. Continue current BP medicines for now     Meds ordered this encounter  Medications  . penicillin v potassium (VEETID) 500 MG tablet    Sig: Take 1 tablet (500 mg total) by mouth 3 (three) times daily.    Dispense:  30 tablet    Refill:  0   Garret Reddish, MD

## 2018-06-30 ENCOUNTER — Other Ambulatory Visit: Payer: Self-pay | Admitting: Internal Medicine

## 2019-01-10 ENCOUNTER — Other Ambulatory Visit: Payer: Self-pay | Admitting: Internal Medicine

## 2019-01-15 ENCOUNTER — Ambulatory Visit (INDEPENDENT_AMBULATORY_CARE_PROVIDER_SITE_OTHER): Payer: Managed Care, Other (non HMO) | Admitting: Internal Medicine

## 2019-01-15 ENCOUNTER — Encounter: Payer: Self-pay | Admitting: Internal Medicine

## 2019-01-15 DIAGNOSIS — R739 Hyperglycemia, unspecified: Secondary | ICD-10-CM | POA: Diagnosis not present

## 2019-01-15 DIAGNOSIS — E538 Deficiency of other specified B group vitamins: Secondary | ICD-10-CM | POA: Diagnosis not present

## 2019-01-15 DIAGNOSIS — Z0000 Encounter for general adult medical examination without abnormal findings: Secondary | ICD-10-CM

## 2019-01-15 DIAGNOSIS — E559 Vitamin D deficiency, unspecified: Secondary | ICD-10-CM

## 2019-01-15 DIAGNOSIS — E611 Iron deficiency: Secondary | ICD-10-CM

## 2019-01-15 MED ORDER — IBUPROFEN 800 MG PO TABS: 800.0000 mg | ORAL_TABLET | Freq: Three times a day (TID) | ORAL | 0 refills | Status: DC | PRN

## 2019-01-15 MED ORDER — AMLODIPINE BESY-BENAZEPRIL HCL 10-20 MG PO CAPS: 1.0000 | ORAL_CAPSULE | Freq: Every day | ORAL | 3 refills | Status: DC

## 2019-01-15 NOTE — Progress Notes (Signed)
Patient ID: [name], female   DOB: 1970/12/06, 48 y.o.   MRN: [id number]  Virtual Visit via Video Note  I connected with Ernest Haber on 01/15/19 at  4:20 PM EDT by a video enabled telemedicine application and verified that I am speaking with the correct person using two identifiers.  Location: Patient: at home Provider: at office   I discussed the limitations of evaluation and management by telemedicine and the availability of in person appointments. The patient expressed understanding and agreed to proceed.  History of Present Illness: Here for wellness and f/u;  Overall doing ok;  Pt denies Chest pain, worsening SOB, DOE, wheezing, orthopnea, PND, worsening LE edema, palpitations, dizziness or syncope.  Pt denies neurological change such as new headache, facial or extremity weakness.  Pt denies polydipsia, polyuria, or low sugar symptoms. Pt states overall good compliance with treatment and medications, good tolerability, and has been trying to follow appropriate diet.  Pt denies worsening depressive symptoms, suicidal ideation or panic. No fever, night sweats, wt loss, loss of appetite, or other constitutional symptoms.  Pt states good ability with ADL's, has low fall risk, home safety reviewed and adequate, no other significant changes in hearing or vision, and only occasionally active with exercise.  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  No new complaints Past Medical History:  Diagnosis Date  . ABDOMINAL TENDERNESS 06/10/2010  . ANEMIA-IRON DEFICIENCY 06/10/2010  . CHEST PAIN 06/10/2010  . HYPERTENSION 06/10/2010  . MENORRHALGIA 06/10/2010  . NEPHROLITHIASIS, HX OF 06/10/2010   Past Surgical History:  Procedure Laterality Date  . CHOLECYSTECTOMY    . TOTAL ABDOMINAL HYSTERECTOMY      reports that she has never smoked. She has never used smokeless tobacco. She reports current alcohol use. She reports that she does  not use drugs. family history includes Diabetes in an other family member; Hypertension in some other family members; Stroke in some other family members. No Known Allergies Current Outpatient Medications on File Prior to Visit  Medication Sig Dispense Refill  . aspirin 81 MG EC tablet Take 81 mg by mouth daily.       No current facility-administered medications on file prior to visit.     Observations/Objective: Alert, NAD, appropriate mood and affect, resps normal, cn 2-12 intact, moves all 4s, no visible rash or swelling Lab Results  Component Value Date   WBC 5.3 11/08/2017   HGB 14.4 11/08/2017   HCT 41.7 11/08/2017   PLT 234.0 11/08/2017   GLUCOSE 93 11/08/2017   CHOL 168 11/08/2017   TRIG 54.0 11/08/2017   HDL 49.00 11/08/2017   LDLCALC 109 (H) 11/08/2017   ALT 29 11/08/2017   AST 22 11/08/2017   NA 139 11/08/2017   K 4.2 11/08/2017   CL 106 11/08/2017   CREATININE 0.86 11/08/2017   BUN 9 11/08/2017   CO2 26 11/08/2017   TSH 0.75 11/08/2017   Assessment and Plan: See notes  Follow Up Instructions: See notes   I discussed the assessment and treatment plan with the patient. The patient was provided an opportunity to ask questions and all were answered. The patient agreed with the plan and demonstrated an understanding of the instructions.   The patient was advised to call back or seek an in-person evaluation if the symptoms worsen or if the condition fails to improve as anticipated.   Cathlean Cower, MD

## 2019-01-15 NOTE — Patient Instructions (Signed)
Please continue all other medications as before, and refills have been done if requested.  Please have the pharmacy call with any other refills you may need.  Please continue your efforts at being more active, low cholesterol diet, and weight control.  You are otherwise up to date with prevention measures today.  Please keep your appointments with your specialists as you may have planned  Please go to the LAB in the Basement (turn left off the elevator) for the tests to be done   You will be contacted by phone if any changes need to be made immediately.  Otherwise, you will receive a letter about your results with an explanation, but please check with MyChart first.  Please remember to sign up for MyChart if you have not done so, as this will be important to you in the future with finding out test results, communicating by private email, and scheduling acute appointments online when needed.  Please return in Jan 2021, or sooner if needed, with Lab testing done 3-5 days before

## 2019-01-15 NOTE — Assessment & Plan Note (Signed)
stable overall by history and exam, recent data reviewed with pt, and pt to continue medical treatment as before,  to f/u any worsening symptoms or concerns, for a1c with labs 

## 2019-01-15 NOTE — Assessment & Plan Note (Signed)

## 2019-08-21 ENCOUNTER — Other Ambulatory Visit: Payer: Self-pay | Admitting: Internal Medicine

## 2019-08-21 ENCOUNTER — Other Ambulatory Visit (INDEPENDENT_AMBULATORY_CARE_PROVIDER_SITE_OTHER): Payer: Managed Care, Other (non HMO)

## 2019-08-21 ENCOUNTER — Encounter: Payer: Self-pay | Admitting: Internal Medicine

## 2019-08-21 DIAGNOSIS — R739 Hyperglycemia, unspecified: Secondary | ICD-10-CM | POA: Diagnosis not present

## 2019-08-21 DIAGNOSIS — E611 Iron deficiency: Secondary | ICD-10-CM | POA: Diagnosis not present

## 2019-08-21 DIAGNOSIS — Z0000 Encounter for general adult medical examination without abnormal findings: Secondary | ICD-10-CM | POA: Diagnosis not present

## 2019-08-21 DIAGNOSIS — E538 Deficiency of other specified B group vitamins: Secondary | ICD-10-CM | POA: Diagnosis not present

## 2019-08-21 DIAGNOSIS — E559 Vitamin D deficiency, unspecified: Secondary | ICD-10-CM | POA: Diagnosis not present

## 2019-08-21 LAB — LIPID PANEL
Cholesterol: 171 mg/dL (ref 0–200)
HDL: 52.2 mg/dL (ref >39.00)
LDL Cholesterol: 91 mg/dL (ref 0–99)
NonHDL: 118.55
Total CHOL/HDL Ratio: 3
Triglycerides: 138 mg/dL (ref 0.0–149.0)
VLDL: 27.6 mg/dL (ref 0.0–40.0)

## 2019-08-21 LAB — URINALYSIS, ROUTINE W REFLEX MICROSCOPIC
Bilirubin Urine: NEGATIVE
Hgb urine dipstick: NEGATIVE
Ketones, ur: NEGATIVE
Leukocytes,Ua: NEGATIVE
Nitrite: NEGATIVE
RBC / HPF: NONE SEEN (ref 0–?)
Specific Gravity, Urine: 1.015 (ref 1.000–1.030)
Total Protein, Urine: NEGATIVE
Urine Glucose: NEGATIVE
Urobilinogen, UA: 0.2 (ref 0.0–1.0)
pH: 7 (ref 5.0–8.0)

## 2019-08-21 LAB — CBC WITH DIFFERENTIAL/PLATELET
Basophils Absolute: 0 10*3/uL (ref 0.0–0.1)
Basophils Relative: 0.5 % (ref 0.0–3.0)
Eosinophils Absolute: 0.2 10*3/uL (ref 0.0–0.7)
Eosinophils Relative: 3.3 % (ref 0.0–5.0)
HCT: 42 % (ref 36.0–46.0)
Hemoglobin: 14.1 g/dL (ref 12.0–15.0)
Lymphocytes Relative: 39.6 % (ref 12.0–46.0)
Lymphs Abs: 2.3 10*3/uL (ref 0.7–4.0)
MCHC: 33.6 g/dL (ref 30.0–36.0)
MCV: 89.1 fl (ref 78.0–100.0)
Monocytes Absolute: 0.5 10*3/uL (ref 0.1–1.0)
Monocytes Relative: 8.5 % (ref 3.0–12.0)
Neutro Abs: 2.8 10*3/uL (ref 1.4–7.7)
Neutrophils Relative %: 48.1 % (ref 43.0–77.0)
Platelets: 262 10*3/uL (ref 150.0–400.0)
RBC: 4.72 Mil/uL (ref 3.87–5.11)
RDW: 13.5 % (ref 11.5–15.5)
WBC: 5.7 10*3/uL (ref 4.0–10.5)

## 2019-08-21 LAB — VITAMIN B12: Vitamin B-12: 304 pg/mL (ref 211–911)

## 2019-08-21 LAB — IBC PANEL
Iron: 77 ug/dL (ref 42–145)
Saturation Ratios: 25.1 % (ref 20.0–50.0)
Transferrin: 219 mg/dL (ref 212.0–360.0)

## 2019-08-21 LAB — HEPATIC FUNCTION PANEL
ALT: 22 U/L (ref 0–35)
AST: 16 U/L (ref 0–37)
Albumin: 4.1 g/dL (ref 3.5–5.2)
Alkaline Phosphatase: 67 U/L (ref 39–117)
Bilirubin, Direct: 0.1 mg/dL (ref 0.0–0.3)
Total Bilirubin: 0.4 mg/dL (ref 0.2–1.2)
Total Protein: 7.2 g/dL (ref 6.0–8.3)

## 2019-08-21 LAB — VITAMIN D 25 HYDROXY (VIT D DEFICIENCY, FRACTURES): VITD: 23.96 ng/mL — ABNORMAL LOW (ref 30.00–100.00)

## 2019-08-21 LAB — HEMOGLOBIN A1C: Hgb A1c MFr Bld: 5.2 % (ref 4.6–6.5)

## 2019-08-21 LAB — BASIC METABOLIC PANEL
BUN: 9 mg/dL (ref 6–23)
CO2: 30 mEq/L (ref 19–32)
Calcium: 9.3 mg/dL (ref 8.4–10.5)
Chloride: 103 mEq/L (ref 96–112)
Creatinine, Ser: 1.01 mg/dL (ref 0.40–1.20)
GFR: 70.42 mL/min (ref >60.00)
Glucose, Bld: 116 mg/dL — ABNORMAL HIGH (ref 70–99)
Potassium: 4.2 mEq/L (ref 3.5–5.1)
Sodium: 138 mEq/L (ref 135–145)

## 2019-08-21 LAB — TSH: TSH: 0.78 u[IU]/mL (ref 0.35–4.50)

## 2019-08-21 MED ORDER — VITAMIN D (ERGOCALCIFEROL) 1.25 MG (50000 UNIT) PO CAPS: 50000.0000 [IU] | ORAL_CAPSULE | ORAL | 0 refills | Status: DC

## 2019-08-23 ENCOUNTER — Encounter: Payer: Self-pay | Admitting: Internal Medicine

## 2019-08-23 ENCOUNTER — Ambulatory Visit (INDEPENDENT_AMBULATORY_CARE_PROVIDER_SITE_OTHER): Payer: Managed Care, Other (non HMO) | Admitting: Internal Medicine

## 2019-08-23 ENCOUNTER — Other Ambulatory Visit: Payer: Self-pay

## 2019-08-23 DIAGNOSIS — I10 Essential (primary) hypertension: Secondary | ICD-10-CM

## 2019-08-23 DIAGNOSIS — Z0000 Encounter for general adult medical examination without abnormal findings: Secondary | ICD-10-CM

## 2019-08-23 DIAGNOSIS — E559 Vitamin D deficiency, unspecified: Secondary | ICD-10-CM | POA: Diagnosis not present

## 2019-08-23 MED ORDER — AMLODIPINE BESY-BENAZEPRIL HCL 10-40 MG PO CAPS: 1.0000 | ORAL_CAPSULE | Freq: Every day | ORAL | 3 refills | Status: DC

## 2019-08-23 MED ORDER — HYDROCHLOROTHIAZIDE 12.5 MG PO CAPS: 12.5000 mg | ORAL_CAPSULE | Freq: Every day | ORAL | 3 refills | Status: DC

## 2019-08-23 NOTE — Patient Instructions (Addendum)
Please take Vitamin D 50000 units weekly for 12 weeks, then plan to change to OTC Vitamin D3 at 2000 units per day, indefinitely.  Please take all new medication as prescribed  - the HCT (mild fluid pill) at 12.5 mg per day  OK to increase the lotrel to 10-40 mg per day  Please continue all other medications as before, and refills have been done if requested.  Please have the pharmacy call with any other refills you may need.  Please continue your efforts at being more active, low cholesterol diet, and weight control.  You are otherwise up to date with prevention measures today.  Please keep your appointments with your specialists as you may have planned  Please return in 6 months, or sooner if needed

## 2019-08-23 NOTE — Progress Notes (Signed)
Subjective:    Patient ID: [name], female    DOB: 1970-09-16, 49 y.o.   MRN: [id number]  HPI  Here for wellness and f/u;  Overall doing ok;  Pt denies Chest pain, worsening SOB, DOE, wheezing, orthopnea, PND, worsening LE edema, palpitations, dizziness or syncope.  Pt denies neurological change such as new headache, facial or extremity weakness.  Pt denies polydipsia, polyuria, or low sugar symptoms. Pt states overall good compliance with treatment and medications, good tolerability, and has been trying to follow appropriate diet.  Pt denies worsening depressive symptoms, suicidal ideation or panic. No fever, night sweats, wt loss, loss of appetite, or other constitutional symptoms.  Pt states good ability with ADL's, has low fall risk, home safety reviewed and adequate, no other significant changes in hearing or vision, and only occasionally active with exercise. Wt Readings from Last 3 Encounters:  08/23/19 256 lb (116.1 kg)  06/17/18 249 lb (112.9 kg)  11/11/17 244 lb (110.7 kg)   BP Readings from Last 3 Encounters:  08/23/19 (!) 160/90  06/17/18 (!) 134/92  11/11/17 138/88  More stress with mother ill with heart dz, aunt with recent surgury in ICU;nephew with motorcycle accident and home after 14 surguries.   Past Medical History:  Diagnosis Date  . ABDOMINAL TENDERNESS 06/10/2010  . ANEMIA-IRON DEFICIENCY 06/10/2010  . CHEST PAIN 06/10/2010  . HYPERTENSION 06/10/2010  . MENORRHALGIA 06/10/2010  . NEPHROLITHIASIS, HX OF 06/10/2010   Past Surgical History:  Procedure Laterality Date  . CHOLECYSTECTOMY    . TOTAL ABDOMINAL HYSTERECTOMY      reports that she has never smoked. She has never used smokeless tobacco. She reports current alcohol use. She reports that she does not use drugs. family history includes Diabetes in an other family member; Hypertension in some other family members; Stroke in some other family members. No Known Allergies Current Outpatient Medications on  File Prior to Visit  Medication Sig Dispense Refill  . aspirin 81 MG EC tablet Take 81 mg by mouth daily.      Marland Kitchen ibuprofen (ADVIL) 800 MG tablet Take 1 tablet (800 mg total) by mouth every 8 (eight) hours as needed. 60 tablet 0  . Vitamin D, Ergocalciferol, (DRISDOL) 1.25 MG (50000 UNIT) CAPS capsule Take 1 capsule (50,000 Units total) by mouth every 7 (seven) days. 12 capsule 0   No current facility-administered medications on file prior to visit.   Review of Systems VS noted,  Constitutional: Pt is oriented to person, place, and time. Appears well-developed and well-nourished, in no significant distress and comfortable Head: Normocephalic and atraumatic  Eyes: Conjunctivae and EOM are normal. Pupils are equal, round, and reactive to light Right Ear: External ear normal without discharge Left Ear: External ear normal without discharge Nose: Nose without discharge or deformity Mouth/Throat: Oropharynx is without other ulcerations and moist  Neck: Normal range of motion. Neck supple. No JVD present. No tracheal deviation present or significant neck LA or mass Cardiovascular: Normal rate, regular rhythm, normal heart sounds and intact distal pulses.   Pulmonary/Chest: WOB normal and breath sounds without rales or wheezing  Abdominal: Soft. Bowel sounds are normal. NT. No HSM  Musculoskeletal: Normal range of motion. Exhibits no edema Lymphadenopathy: Has no other cervical adenopathy.  Neurological: Pt is alert and oriented to person, place, and time. Pt has normal reflexes. No cranial nerve deficit. Motor grossly intact, Gait intact Skin: Skin is warm and dry. No rash noted or new ulcerations Psychiatric:  Has normal  mood and affect. Behavior is normal without agitation All otherwise neg per pt    Objective:   Physical Exam BP (!) 160/90 (BP Location: Left Arm, Patient Position: Sitting, Cuff Size: Large)   Pulse 89   Temp 98.3 F (36.8 C) (Oral)   Ht 5\' 2"  (1.575 m)   Wt 256 lb  (116.1 kg)   SpO2 99%   BMI 46.82 kg/m  VS noted,  Constitutional: Pt is oriented to person, place, and time. Appears well-developed and well-nourished, in no significant distress and comfortable Head: Normocephalic and atraumatic  Eyes: Conjunctivae and EOM are normal. Pupils are equal, round, and reactive to light Right Ear: External ear normal without discharge Left Ear: External ear normal without discharge Nose: Nose without discharge or deformity Mouth/Throat: Oropharynx is without other ulcerations and moist  Neck: Normal range of motion. Neck supple. No JVD present. No tracheal deviation present or significant neck LA or mass Cardiovascular: Normal rate, regular rhythm, normal heart sounds and intact distal pulses.   Pulmonary/Chest: WOB normal and breath sounds without rales or wheezing  Abdominal: Soft. Bowel sounds are normal. NT. No HSM  Musculoskeletal: Normal range of motion. Exhibits trace bilat edema Lymphadenopathy: Has no other cervical adenopathy.  Neurological: Pt is alert and oriented to person, place, and time. Pt has normal reflexes. No cranial nerve deficit. Motor grossly intact, Gait intact Skin: Skin is warm and dry. No rash noted or new ulcerations Psychiatric:  Has normal mood and affect. Behavior is normal without agitation All otherwise neg per pt Lab Results  Component Value Date   WBC 5.7 08/21/2019   HGB 14.1 08/21/2019   HCT 42.0 08/21/2019   PLT 262.0 08/21/2019   GLUCOSE 116 (H) 08/21/2019   CHOL 171 08/21/2019   TRIG 138.0 08/21/2019   HDL 52.20 08/21/2019   LDLCALC 91 08/21/2019   ALT 22 08/21/2019   AST 16 08/21/2019   NA 138 08/21/2019   K 4.2 08/21/2019   CL 103 08/21/2019   CREATININE 1.01 08/21/2019   BUN 9 08/21/2019   CO2 30 08/21/2019   TSH 0.78 08/21/2019   HGBA1C 5.2 08/21/2019      Assessment & Plan:

## 2019-08-25 ENCOUNTER — Encounter: Payer: Self-pay | Admitting: Internal Medicine

## 2019-08-25 DIAGNOSIS — E559 Vitamin D deficiency, unspecified: Secondary | ICD-10-CM | POA: Insufficient documentation

## 2019-08-25 NOTE — Assessment & Plan Note (Addendum)
Uncontrolled, for increased loterl 10-40 qd and add hct 12.5 qd,,  to f/u any worsening symptoms or concerns

## 2019-08-25 NOTE — Assessment & Plan Note (Signed)
For contd oral replacement 

## 2019-08-25 NOTE — Assessment & Plan Note (Signed)

## 2020-02-19 ENCOUNTER — Ambulatory Visit: Payer: Managed Care, Other (non HMO) | Admitting: Internal Medicine

## 2020-05-09 DIAGNOSIS — M79674 Pain in right toe(s): Secondary | ICD-10-CM | POA: Diagnosis not present

## 2020-05-09 DIAGNOSIS — L600 Ingrowing nail: Secondary | ICD-10-CM | POA: Diagnosis not present

## 2020-08-11 DIAGNOSIS — Z20822 Contact with and (suspected) exposure to covid-19: Secondary | ICD-10-CM | POA: Diagnosis not present

## 2020-08-15 DIAGNOSIS — Z20828 Contact with and (suspected) exposure to other viral communicable diseases: Secondary | ICD-10-CM | POA: Diagnosis not present

## 2020-08-19 DIAGNOSIS — Z1159 Encounter for screening for other viral diseases: Secondary | ICD-10-CM | POA: Diagnosis not present

## 2020-11-18 ENCOUNTER — Telehealth: Payer: Self-pay | Admitting: Internal Medicine

## 2020-11-18 DIAGNOSIS — E611 Iron deficiency: Secondary | ICD-10-CM

## 2020-11-18 DIAGNOSIS — R739 Hyperglycemia, unspecified: Secondary | ICD-10-CM

## 2020-11-18 DIAGNOSIS — E538 Deficiency of other specified B group vitamins: Secondary | ICD-10-CM

## 2020-11-18 DIAGNOSIS — E559 Vitamin D deficiency, unspecified: Secondary | ICD-10-CM

## 2020-11-18 DIAGNOSIS — I10 Essential (primary) hypertension: Secondary | ICD-10-CM

## 2020-11-18 NOTE — Telephone Encounter (Signed)
Patient wondering if she needs to get her lab work done before her appointment on 04.22.22

## 2020-11-18 NOTE — Telephone Encounter (Signed)
[place] labs are ordered and can be done at the [facility] lab , as no appt is needed

## 2020-11-19 NOTE — Telephone Encounter (Signed)
Patient notified of lab orders

## 2020-11-22 ENCOUNTER — Other Ambulatory Visit: Payer: Self-pay | Admitting: Internal Medicine

## 2020-11-22 NOTE — Telephone Encounter (Signed)
Please refill as per office routine med refill policy (all routine meds refilled for 3 mo or monthly per pt preference up to one year from last visit, then month to month grace period for 3 mo, then further med refills will have to be denied)  

## 2020-11-25 ENCOUNTER — Telehealth: Payer: Self-pay | Admitting: Internal Medicine

## 2020-11-25 NOTE — Telephone Encounter (Signed)
amLODipine-benazepril (LOTREL) 10-40 MG capsule [facility] [address] [place] Phone:  [phone]  Fax:  [phone]     Last seen- 01.14.21 Next apt- 04.22.22

## 2020-11-27 ENCOUNTER — Encounter: Payer: Self-pay | Admitting: Internal Medicine

## 2020-11-27 ENCOUNTER — Other Ambulatory Visit (INDEPENDENT_AMBULATORY_CARE_PROVIDER_SITE_OTHER): Payer: BC Managed Care – PPO

## 2020-11-27 ENCOUNTER — Other Ambulatory Visit: Payer: Self-pay

## 2020-11-27 DIAGNOSIS — E611 Iron deficiency: Secondary | ICD-10-CM

## 2020-11-27 DIAGNOSIS — R739 Hyperglycemia, unspecified: Secondary | ICD-10-CM

## 2020-11-27 DIAGNOSIS — E559 Vitamin D deficiency, unspecified: Secondary | ICD-10-CM

## 2020-11-27 DIAGNOSIS — I10 Essential (primary) hypertension: Secondary | ICD-10-CM | POA: Diagnosis not present

## 2020-11-27 DIAGNOSIS — E538 Deficiency of other specified B group vitamins: Secondary | ICD-10-CM | POA: Diagnosis not present

## 2020-11-27 LAB — URINALYSIS, ROUTINE W REFLEX MICROSCOPIC
Bilirubin Urine: NEGATIVE
Hgb urine dipstick: NEGATIVE
Ketones, ur: NEGATIVE
Nitrite: POSITIVE — AB
RBC / HPF: NONE SEEN (ref 0–?)
Specific Gravity, Urine: 1.015 (ref 1.000–1.030)
Total Protein, Urine: NEGATIVE
Urine Glucose: NEGATIVE
Urobilinogen, UA: 0.2 (ref 0.0–1.0)
pH: 6.5 (ref 5.0–8.0)

## 2020-11-27 LAB — IBC PANEL
Iron: 67 ug/dL (ref 42–145)
Saturation Ratios: 24.8 % (ref 20.0–50.0)
Transferrin: 193 mg/dL — ABNORMAL LOW (ref 212.0–360.0)

## 2020-11-27 LAB — HEPATIC FUNCTION PANEL
ALT: 19 U/L (ref 0–35)
AST: 15 U/L (ref 0–37)
Albumin: 3.6 g/dL (ref 3.5–5.2)
Alkaline Phosphatase: 52 U/L (ref 39–117)
Bilirubin, Direct: 0.1 mg/dL (ref 0.0–0.3)
Total Bilirubin: 0.5 mg/dL (ref 0.2–1.2)
Total Protein: 6.5 g/dL (ref 6.0–8.3)

## 2020-11-27 LAB — CBC WITH DIFFERENTIAL/PLATELET
Basophils Absolute: 0 10*3/uL (ref 0.0–0.1)
Basophils Relative: 0.6 % (ref 0.0–3.0)
Eosinophils Absolute: 0.1 10*3/uL (ref 0.0–0.7)
Eosinophils Relative: 3.2 % (ref 0.0–5.0)
HCT: 40 % (ref 36.0–46.0)
Hemoglobin: 13.5 g/dL (ref 12.0–15.0)
Lymphocytes Relative: 38.7 % (ref 12.0–46.0)
Lymphs Abs: 1.7 10*3/uL (ref 0.7–4.0)
MCHC: 33.7 g/dL (ref 30.0–36.0)
MCV: 88.3 fl (ref 78.0–100.0)
Monocytes Absolute: 0.6 10*3/uL (ref 0.1–1.0)
Monocytes Relative: 13.6 % — ABNORMAL HIGH (ref 3.0–12.0)
Neutro Abs: 2 10*3/uL (ref 1.4–7.7)
Neutrophils Relative %: 43.9 % (ref 43.0–77.0)
Platelets: 219 10*3/uL (ref 150.0–400.0)
RBC: 4.53 Mil/uL (ref 3.87–5.11)
RDW: 13.7 % (ref 11.5–15.5)
WBC: 4.5 10*3/uL (ref 4.0–10.5)

## 2020-11-27 LAB — BASIC METABOLIC PANEL
BUN: 12 mg/dL (ref 6–23)
CO2: 28 mEq/L (ref 19–32)
Calcium: 9 mg/dL (ref 8.4–10.5)
Chloride: 107 mEq/L (ref 96–112)
Creatinine, Ser: 1.1 mg/dL (ref 0.40–1.20)
GFR: 58.58 mL/min — ABNORMAL LOW (ref >60.00)
Glucose, Bld: 95 mg/dL (ref 70–99)
Potassium: 4.3 mEq/L (ref 3.5–5.1)
Sodium: 141 mEq/L (ref 135–145)

## 2020-11-27 LAB — VITAMIN B12: Vitamin B-12: 335 pg/mL (ref 211–911)

## 2020-11-27 LAB — LIPID PANEL
Cholesterol: 152 mg/dL (ref 0–200)
HDL: 47.8 mg/dL (ref >39.00)
LDL Cholesterol: 90 mg/dL (ref 0–99)
NonHDL: 104.17
Total CHOL/HDL Ratio: 3
Triglycerides: 71 mg/dL (ref 0.0–149.0)
VLDL: 14.2 mg/dL (ref 0.0–40.0)

## 2020-11-27 LAB — VITAMIN D 25 HYDROXY (VIT D DEFICIENCY, FRACTURES): VITD: 21.42 ng/mL — ABNORMAL LOW (ref 30.00–100.00)

## 2020-11-27 LAB — TSH: TSH: 0.51 u[IU]/mL (ref 0.35–4.50)

## 2020-11-27 LAB — HEMOGLOBIN A1C: Hgb A1c MFr Bld: 5.2 % (ref 4.6–6.5)

## 2020-11-27 LAB — FERRITIN: Ferritin: 109.5 ng/mL (ref 10.0–291.0)

## 2020-11-28 ENCOUNTER — Ambulatory Visit (INDEPENDENT_AMBULATORY_CARE_PROVIDER_SITE_OTHER): Payer: BC Managed Care – PPO | Admitting: Internal Medicine

## 2020-11-28 ENCOUNTER — Other Ambulatory Visit: Payer: Self-pay

## 2020-11-28 ENCOUNTER — Encounter: Payer: Self-pay | Admitting: Internal Medicine

## 2020-11-28 VITALS — BP 142/84 | HR 65 | Temp 98.3°F | Ht 62.0 in | Wt 253.2 lb

## 2020-11-28 DIAGNOSIS — E559 Vitamin D deficiency, unspecified: Secondary | ICD-10-CM

## 2020-11-28 DIAGNOSIS — Z23 Encounter for immunization: Secondary | ICD-10-CM | POA: Diagnosis not present

## 2020-11-28 DIAGNOSIS — Z1211 Encounter for screening for malignant neoplasm of colon: Secondary | ICD-10-CM | POA: Diagnosis not present

## 2020-11-28 DIAGNOSIS — E7800 Pure hypercholesterolemia, unspecified: Secondary | ICD-10-CM

## 2020-11-28 DIAGNOSIS — I10 Essential (primary) hypertension: Secondary | ICD-10-CM

## 2020-11-28 DIAGNOSIS — Z1159 Encounter for screening for other viral diseases: Secondary | ICD-10-CM | POA: Diagnosis not present

## 2020-11-28 DIAGNOSIS — R739 Hyperglycemia, unspecified: Secondary | ICD-10-CM

## 2020-11-28 DIAGNOSIS — Z0001 Encounter for general adult medical examination with abnormal findings: Secondary | ICD-10-CM | POA: Diagnosis not present

## 2020-11-28 MED ORDER — AMLODIPINE BESY-BENAZEPRIL HCL 10-40 MG PO CAPS: 1.0000 | ORAL_CAPSULE | Freq: Every day | ORAL | 3 refills | Status: DC

## 2020-11-28 MED ORDER — HYDROCHLOROTHIAZIDE 12.5 MG PO CAPS: 12.5000 mg | ORAL_CAPSULE | Freq: Every day | ORAL | 3 refills | Status: DC

## 2020-11-28 NOTE — Progress Notes (Signed)
Patient ID: [name], female   DOB: November 16, 1970, 50 y.o.   MRN: [id number]         Chief Complaint:: wellness exam and htn, low vit d, hyperglycemia       HPI:  [name] is a 50 y.o. female here for wellness exam; declines covid booster, has pap and mamogram appt for June 2022, and ok due for Tdap and colonoscopy; o/w up to date with preventive referrals, and immunizations                        Also BP at home has been mild elevated but ran out of BP med several weeks ago.  Taking vit d 2000 u qed.  Did have covid infection dec 2021 with some wt loss but regained most of the wt.  Was out of work for 1 mo to recover.  Pt denies chest pain, increased sob or doe, wheezing, orthopnea, PND, increased LE swelling, palpitations, dizziness or syncope.   Pt denies polydipsia, polyuria,  Pt denies fever, wt loss, night sweats, loss of appetite, or other constitutional symptoms .  Denies new neuro focal s/s.  No other new complaints    Wt Readings from Last 3 Encounters:  11/28/20 253 lb 3.2 oz (114.9 kg)  08/23/19 256 lb (116.1 kg)  06/17/18 249 lb (112.9 kg)   BP Readings from Last 3 Encounters:  11/28/20 (!) 142/84  08/23/19 (!) 160/90  06/17/18 (!) 134/92   Immunization History  Administered Date(s) Administered  . PFIZER(Purple Top)SARS-COV-2 Vaccination 04/02/2020, 04/23/2020  . Td 06/10/2010  . Tdap 11/28/2020   Health Maintenance Due  Topic Date Due  . Hepatitis C Screening  Never done  . COLONOSCOPY (Pts 45-74yrs Insurance coverage will need to be confirmed)  Never done  . PAP SMEAR-Modifier  12/07/2019      Past Medical History:  Diagnosis Date  . ABDOMINAL TENDERNESS 06/10/2010  . ANEMIA-IRON DEFICIENCY 06/10/2010  . CHEST PAIN 06/10/2010  . HYPERTENSION 06/10/2010  . MENORRHALGIA 06/10/2010  . NEPHROLITHIASIS, HX OF 06/10/2010   Past Surgical History:  Procedure Laterality Date  . CHOLECYSTECTOMY    . TOTAL ABDOMINAL HYSTERECTOMY      reports that she has never  smoked. She has never used smokeless tobacco. She reports current alcohol use. She reports that she does not use drugs. family history includes Diabetes in an other family member; Hypertension in some other family members; Stroke in some other family members. No Known Allergies Current Outpatient Medications on File Prior to Visit  Medication Sig Dispense Refill  . aspirin 81 MG EC tablet Take 81 mg by mouth daily.    . Cholecalciferol (VITAMIN D3) 50 MCG (2000 UT) TABS Take 2 tablets by mouth daily.    Marland Kitchen ibuprofen (ADVIL) 800 MG tablet Take 1 tablet (800 mg total) by mouth every 8 (eight) hours as needed. 60 tablet 0   No current facility-administered medications on file prior to visit.        ROS:  All others reviewed and negative.  Objective        PE:  BP (!) 142/84 (BP Location: Left Arm, Patient Position: Sitting, Cuff Size: Large)   Pulse 65   Temp 98.3 F (36.8 C) (Oral)   Ht 5\' 2"  (1.575 m)   Wt 253 lb 3.2 oz (114.9 kg)   SpO2 98%   BMI 46.31 kg/m  Constitutional: Pt appears in NAD               HENT: Head: NCAT.                Right Ear: External ear normal.                 Left Ear: External ear normal.                Eyes: . Pupils are equal, round, and reactive to light. Conjunctivae and EOM are normal               Nose: without d/c or deformity               Neck: Neck supple. Gross normal ROM               Cardiovascular: Normal rate and regular rhythm.                 Pulmonary/Chest: Effort normal and breath sounds without rales or wheezing.                Abd:  Soft, NT, ND, + BS, no organomegaly               Neurological: Pt is alert. At baseline orientation, motor grossly intact               Skin: Skin is warm. No rashes, no other new lesions, LE edema - none               Psychiatric: Pt behavior is normal without agitation   Micro: none  Cardiac tracings I have personally interpreted today:  none  Pertinent Radiological findings  (summarize): none   Lab Results  Component Value Date   WBC 4.5 11/27/2020   HGB 13.5 11/27/2020   HCT 40.0 11/27/2020   PLT 219.0 11/27/2020   GLUCOSE 95 11/27/2020   CHOL 152 11/27/2020   TRIG 71.0 11/27/2020   HDL 47.80 11/27/2020   LDLCALC 90 11/27/2020   ALT 19 11/27/2020   AST 15 11/27/2020   NA 141 11/27/2020   K 4.3 11/27/2020   CL 107 11/27/2020   CREATININE 1.10 11/27/2020   BUN 12 11/27/2020   CO2 28 11/27/2020   TSH 0.51 11/27/2020   HGBA1C 5.2 11/27/2020   Assessment/Plan:  [name] is a 50 y.o. Black or African American [2] female with  has a past medical history of ABDOMINAL TENDERNESS (06/10/2010), ANEMIA-IRON DEFICIENCY (06/10/2010), CHEST PAIN (06/10/2010), HYPERTENSION (06/10/2010), MENORRHALGIA (06/10/2010), and NEPHROLITHIASIS, HX OF (06/10/2010).  Encounter for well adult exam with abnormal findings Age and sex appropriate education and counseling updated with regular exercise and diet Referrals for preventative services - for colonoscopy, and f/u pap/mammogram with gyn June 2022, and hep c screen  Immunizations addressed - declines covid booster, but ok for Tdap Smoking counseling  - none needed Evidence for depression or other mood disorder - none significant Most recent labs reviewed. I have personally reviewed and have noted: 1) the patient's medical and social history 2) The patient's current medications and supplements 3) The patient's height, weight, and BMI have been recorded in the chart   Vitamin D deficiency Last vitamin D Lab Results  Component Value Date   VD25OH 21.42 (L) 11/27/2020   Still low  - to increase the vitd3 to 4000 u qd, oral replacement   Hyperglycemia Lab Results  Component Value Date   HGBA1C 5.2 11/27/2020   Stable,  pt to continue current medical treatment  - diet   HLD (hyperlipidemia) Lab Results  Component Value Date   LDLCALC 90 11/27/2020   Stable, pt to continue current low chol diet   Essential  hypertension BP Readings from Last 3 Encounters:  11/28/20 (!) 142/84  08/23/19 (!) 160/90  06/17/18 (!) 134/92   Stable, pt to restart medical treatment  - lotrel   Followup: Return in about 1 year (around 11/28/2021).  Cathlean Cower, MD 11/30/2020 12:13 PM [facility]

## 2020-11-28 NOTE — Patient Instructions (Addendum)
You had the Tdap tetanus shot today  You will be contacted regarding the referral for: colonoscopy  Ok to increase the Vitamin D to 4000 units per day  Please continue all other medications as before, and refills have been done if requested.  Please have the pharmacy call with any other refills you may need.  Please continue your efforts at being more active, low cholesterol diet, and weight control.  You are otherwise up to date with prevention measures today.  Please keep your appointments with your specialists as you may have planned  Please make an Appointment to return for your 1 year visit, or sooner if needed, with Lab testing by Appointment as well, to be done about 3-5 days before at the [facility] (so this is for TWO appointments - please see the scheduling desk as you leave)  Due to the ongoing Covid 19 pandemic, our lab now requires an appointment for any labs done at our office.  If you need labs done and do not have an appointment, please call our office ahead of time to schedule before presenting to the lab for your testing.

## 2020-11-30 ENCOUNTER — Encounter: Payer: Self-pay | Admitting: Internal Medicine

## 2020-11-30 NOTE — Assessment & Plan Note (Signed)
Last vitamin D Lab Results  Component Value Date   VD25OH 21.42 (L) 11/27/2020   Still low  - to increase the vitd3 to 4000 u qd, oral replacement

## 2020-11-30 NOTE — Assessment & Plan Note (Signed)
Lab Results  Component Value Date   HGBA1C 5.2 11/27/2020   Stable, pt to continue current medical treatment  - diet

## 2020-11-30 NOTE — Assessment & Plan Note (Signed)
Lab Results  Component Value Date   LDLCALC 90 11/27/2020   Stable, pt to continue current low chol diet

## 2020-11-30 NOTE — Assessment & Plan Note (Addendum)
Age and sex appropriate education and counseling updated with regular exercise and diet Referrals for preventative services - for colonoscopy, and f/u pap/mammogram with gyn June 2022, and hep c screen  Immunizations addressed - declines covid booster, but ok for Tdap Smoking counseling  - none needed Evidence for depression or other mood disorder - none significant Most recent labs reviewed. I have personally reviewed and have noted: 1) the patient's medical and social history 2) The patient's current medications and supplements 3) The patient's height, weight, and BMI have been recorded in the chart

## 2020-11-30 NOTE — Assessment & Plan Note (Signed)
BP Readings from Last 3 Encounters:  11/28/20 (!) 142/84  08/23/19 (!) 160/90  06/17/18 (!) 134/92   Stable, pt to restart medical treatment  - lotrel

## 2021-01-02 DIAGNOSIS — L600 Ingrowing nail: Secondary | ICD-10-CM | POA: Diagnosis not present

## 2021-01-02 DIAGNOSIS — M25774 Osteophyte, right foot: Secondary | ICD-10-CM | POA: Diagnosis not present

## 2021-01-19 DIAGNOSIS — L600 Ingrowing nail: Secondary | ICD-10-CM | POA: Diagnosis not present

## 2021-03-16 DIAGNOSIS — Z1231 Encounter for screening mammogram for malignant neoplasm of breast: Secondary | ICD-10-CM | POA: Diagnosis not present

## 2021-03-16 DIAGNOSIS — Z01419 Encounter for gynecological examination (general) (routine) without abnormal findings: Secondary | ICD-10-CM | POA: Diagnosis not present

## 2021-03-16 DIAGNOSIS — Z6841 Body Mass Index (BMI) 40.0 and over, adult: Secondary | ICD-10-CM | POA: Diagnosis not present

## 2021-03-24 DIAGNOSIS — Z13820 Encounter for screening for osteoporosis: Secondary | ICD-10-CM | POA: Diagnosis not present

## 2021-05-13 DIAGNOSIS — Z01818 Encounter for other preprocedural examination: Secondary | ICD-10-CM | POA: Diagnosis not present

## 2021-08-11 DIAGNOSIS — Z1211 Encounter for screening for malignant neoplasm of colon: Secondary | ICD-10-CM | POA: Diagnosis not present

## 2021-08-11 DIAGNOSIS — D128 Benign neoplasm of rectum: Secondary | ICD-10-CM | POA: Diagnosis not present

## 2021-12-23 ENCOUNTER — Other Ambulatory Visit: Payer: Self-pay | Admitting: Internal Medicine

## 2021-12-23 NOTE — Telephone Encounter (Signed)
Please refill as per office routine med refill policy (all routine meds to be refilled for 3 mo or monthly (per pt preference) up to one year from last visit, then month to month grace period for 3 mo, then further med refills will have to be denied) ? ?

## 2022-01-18 ENCOUNTER — Ambulatory Visit (INDEPENDENT_AMBULATORY_CARE_PROVIDER_SITE_OTHER): Payer: BC Managed Care – PPO | Admitting: Internal Medicine

## 2022-01-18 ENCOUNTER — Encounter: Payer: Self-pay | Admitting: Internal Medicine

## 2022-01-18 VITALS — BP 172/108 | HR 72 | Temp 98.2°F | Ht 62.0 in | Wt 253.0 lb

## 2022-01-18 DIAGNOSIS — I10 Essential (primary) hypertension: Secondary | ICD-10-CM | POA: Diagnosis not present

## 2022-01-18 DIAGNOSIS — R739 Hyperglycemia, unspecified: Secondary | ICD-10-CM

## 2022-01-18 DIAGNOSIS — E538 Deficiency of other specified B group vitamins: Secondary | ICD-10-CM | POA: Diagnosis not present

## 2022-01-18 DIAGNOSIS — Z1159 Encounter for screening for other viral diseases: Secondary | ICD-10-CM

## 2022-01-18 DIAGNOSIS — E559 Vitamin D deficiency, unspecified: Secondary | ICD-10-CM

## 2022-01-18 DIAGNOSIS — Z0001 Encounter for general adult medical examination with abnormal findings: Secondary | ICD-10-CM | POA: Diagnosis not present

## 2022-01-18 DIAGNOSIS — E7800 Pure hypercholesterolemia, unspecified: Secondary | ICD-10-CM | POA: Diagnosis not present

## 2022-01-18 LAB — CBC WITH DIFFERENTIAL/PLATELET
Basophils Absolute: 0 10*3/uL (ref 0.0–0.1)
Basophils Relative: 0.5 % (ref 0.0–3.0)
Eosinophils Absolute: 0.2 10*3/uL (ref 0.0–0.7)
Eosinophils Relative: 3.3 % (ref 0.0–5.0)
HCT: 41.6 % (ref 36.0–46.0)
Hemoglobin: 14.1 g/dL (ref 12.0–15.0)
Lymphocytes Relative: 40.4 % (ref 12.0–46.0)
Lymphs Abs: 2.5 10*3/uL (ref 0.7–4.0)
MCHC: 34 g/dL (ref 30.0–36.0)
MCV: 88.3 fl (ref 78.0–100.0)
Monocytes Absolute: 0.6 10*3/uL (ref 0.1–1.0)
Monocytes Relative: 9 % (ref 3.0–12.0)
Neutro Abs: 2.9 10*3/uL (ref 1.4–7.7)
Neutrophils Relative %: 46.8 % (ref 43.0–77.0)
Platelets: 244 10*3/uL (ref 150.0–400.0)
RBC: 4.71 Mil/uL (ref 3.87–5.11)
RDW: 13.4 % (ref 11.5–15.5)
WBC: 6.3 10*3/uL (ref 4.0–10.5)

## 2022-01-18 LAB — URINALYSIS, ROUTINE W REFLEX MICROSCOPIC
Bilirubin Urine: NEGATIVE
Hgb urine dipstick: NEGATIVE
Ketones, ur: NEGATIVE
Leukocytes,Ua: NEGATIVE
Nitrite: NEGATIVE
Specific Gravity, Urine: 1.02 (ref 1.000–1.030)
Total Protein, Urine: NEGATIVE
Urine Glucose: NEGATIVE
Urobilinogen, UA: 0.2 (ref 0.0–1.0)
pH: 6 (ref 5.0–8.0)

## 2022-01-18 LAB — HEPATIC FUNCTION PANEL
ALT: 15 U/L (ref 0–35)
AST: 15 U/L (ref 0–37)
Albumin: 4 g/dL (ref 3.5–5.2)
Alkaline Phosphatase: 68 U/L (ref 39–117)
Bilirubin, Direct: 0.2 mg/dL (ref 0.0–0.3)
Total Bilirubin: 0.5 mg/dL (ref 0.2–1.2)
Total Protein: 7 g/dL (ref 6.0–8.3)

## 2022-01-18 LAB — BASIC METABOLIC PANEL
BUN: 10 mg/dL (ref 6–23)
CO2: 27 mEq/L (ref 19–32)
Calcium: 9.2 mg/dL (ref 8.4–10.5)
Chloride: 105 mEq/L (ref 96–112)
Creatinine, Ser: 1.02 mg/dL (ref 0.40–1.20)
GFR: 63.63 mL/min (ref >60.00)
Glucose, Bld: 87 mg/dL (ref 70–99)
Potassium: 3.7 mEq/L (ref 3.5–5.1)
Sodium: 139 mEq/L (ref 135–145)

## 2022-01-18 LAB — HEMOGLOBIN A1C: Hgb A1c MFr Bld: 5.3 % (ref 4.6–6.5)

## 2022-01-18 LAB — LIPID PANEL
Cholesterol: 183 mg/dL (ref 0–200)
HDL: 50.4 mg/dL (ref >39.00)
LDL Cholesterol: 120 mg/dL — ABNORMAL HIGH (ref 0–99)
NonHDL: 132.64
Total CHOL/HDL Ratio: 4
Triglycerides: 65 mg/dL (ref 0.0–149.0)
VLDL: 13 mg/dL (ref 0.0–40.0)

## 2022-01-18 LAB — VITAMIN D 25 HYDROXY (VIT D DEFICIENCY, FRACTURES): VITD: 25.2 ng/mL — ABNORMAL LOW (ref 30.00–100.00)

## 2022-01-18 LAB — TSH: TSH: 0.64 u[IU]/mL (ref 0.35–5.50)

## 2022-01-18 MED ORDER — AMLODIPINE BESY-BENAZEPRIL HCL 10-40 MG PO CAPS
1.0000 | ORAL_CAPSULE | Freq: Every day | ORAL | 3 refills | Status: DC
Start: 2022-01-18 — End: 2022-08-10

## 2022-01-18 MED ORDER — HYDROCHLOROTHIAZIDE 12.5 MG PO CAPS: 12.5000 mg | ORAL_CAPSULE | Freq: Every day | ORAL | 3 refills | Status: DC

## 2022-01-18 NOTE — Assessment & Plan Note (Signed)
Lab Results  Component Value Date   HGBA1C 5.3 01/18/2022   Stable, pt to continue current medical treatment  - diet, exercise, wt control

## 2022-01-18 NOTE — Assessment & Plan Note (Signed)
Last vitamin D Lab Results  Component Value Date   VD25OH 25.20 (L) 01/18/2022   Low, to start oral replacement

## 2022-01-18 NOTE — Assessment & Plan Note (Signed)
BP Readings from Last 3 Encounters:  01/18/22 (!) 172/108  11/28/20 (!) 142/84  08/23/19 (!) 160/90   Uncontrolled, has been out of BP med for 3 days, pt to restart medical treatment lotrel 10-40 mg qd, hct 12.5 qd

## 2022-01-18 NOTE — Assessment & Plan Note (Signed)
Age and sex appropriate education and counseling updated with regular exercise and diet Referrals for preventative services - declines hep c screen Immunizations addressed - declines covid booster, shiingrix Smoking counseling  - none needed Evidence for depression or other mood disorder - none significant Most recent labs reviewed. I have personally reviewed and have noted: 1) the patient's medical and social history 2) The patient's current medications and supplements 3) The patient's height, weight, and BMI have been recorded in the chart

## 2022-01-18 NOTE — Patient Instructions (Addendum)
Please continue all other medications as before, including the restart of your current medications  Please have the pharmacy call with any other refills you may need.  Please continue your efforts at being more active, low cholesterol diet, and weight control.  You are otherwise up to date with prevention measures today.  Please keep your appointments with your specialists as you may have planned  Please go to the LAB at the blood drawing area for the tests to be done  You will be contacted by phone if any changes need to be made immediately.  Otherwise, you will receive a letter about your results with an explanation, but please check with MyChart first.  Please remember to sign up for MyChart if you have not done so, as this will be important to you in the future with finding out test results, communicating by private email, and scheduling acute appointments online when needed.  Please make an Appointment to return for your 1 year visit, or sooner if needed, with the Labs done at the [facility] lab a few days ahead

## 2022-01-18 NOTE — Progress Notes (Signed)
Patient ID: [name], female   DOB: 12/06/1970, 51 y.o.   MRN: [id number]         Chief Complaint:: wellness exam and elevated BP, obesity, hyperglycemia, low vit d       HPI:  [name] is a 51 y.o. female here for wellness exam; delcines covid booster, shingrix, hep c screen, o/w up to date                        Also Pt denies chest pain, increased sob or doe, wheezing, orthopnea, PND, increased LE swelling, palpitations, dizziness or syncope.  Has been out of BP meds for 3 days.   Pt denies polydipsia, polyuria, or new focal neuro s/s.    Pt denies fever, wt loss, night sweats, loss of appetite, or other constitutional symptoms     Wt Readings from Last 3 Encounters:  01/18/22 253 lb (114.8 kg)  11/28/20 253 lb 3.2 oz (114.9 kg)  08/23/19 256 lb (116.1 kg)   BP Readings from Last 3 Encounters:  01/18/22 (!) 172/108  11/28/20 (!) 142/84  08/23/19 (!) 160/90   Immunization History  Administered Date(s) Administered   PFIZER(Purple Top)SARS-COV-2 Vaccination 04/02/2020, 04/23/2020   Td 06/10/2010   Tdap 11/28/2020   There are no preventive care reminders to display for this patient.     Past Medical History:  Diagnosis Date   ABDOMINAL TENDERNESS 06/10/2010   ANEMIA-IRON DEFICIENCY 06/10/2010   CHEST PAIN 06/10/2010   HYPERTENSION 06/10/2010   MENORRHALGIA 06/10/2010   NEPHROLITHIASIS, HX OF 06/10/2010   Past Surgical History:  Procedure Laterality Date   CHOLECYSTECTOMY     TOTAL ABDOMINAL HYSTERECTOMY      reports that she has never smoked. She has never used smokeless tobacco. She reports current alcohol use. She reports that she does not use drugs. family history includes Diabetes in an other family member; Hypertension in some other family members; Stroke in some other family members. No Known Allergies Current Outpatient Medications on File Prior to Visit  Medication Sig Dispense Refill   aspirin 81 MG EC tablet Take 81 mg by mouth daily.      Cholecalciferol (VITAMIN D3) 50 MCG (2000 UT) TABS Take 2 tablets by mouth daily.     Multiple Vitamin (MULTI VITAMIN) TABS 1 tablet     No current facility-administered medications on file prior to visit.        ROS:  All others reviewed and negative.  Objective        PE:  BP (!) 172/108 (BP Location: Right Arm, Patient Position: Sitting, Cuff Size: Large)   Pulse 72   Temp 98.2 F (36.8 C) (Oral)   Ht '5\' 2"'$  (1.575 m)   Wt 253 lb (114.8 kg)   SpO2 98%   BMI 46.27 kg/m                 Constitutional: Pt appears in NAD               HENT: Head: NCAT.                Right Ear: External ear normal.                 Left Ear: External ear normal.                Eyes: . Pupils are equal, round, and reactive to light. Conjunctivae and EOM are normal  Nose: without d/c or deformity               Neck: Neck supple. Gross normal ROM               Cardiovascular: Normal rate and regular rhythm.                 Pulmonary/Chest: Effort normal and breath sounds without rales or wheezing.                Abd:  Soft, NT, ND, + BS, no organomegaly               Neurological: Pt is alert. At baseline orientation, motor grossly intact               Skin: Skin is warm. No rashes, no other new lesions, LE edema - none               Psychiatric: Pt behavior is normal without agitation   Micro: none  Cardiac tracings I have personally interpreted today:  none  Pertinent Radiological findings (summarize): none   Lab Results  Component Value Date   WBC 6.3 01/18/2022   HGB 14.1 01/18/2022   HCT 41.6 01/18/2022   PLT 244.0 01/18/2022   GLUCOSE 87 01/18/2022   CHOL 183 01/18/2022   TRIG 65.0 01/18/2022   HDL 50.40 01/18/2022   LDLCALC 120 (H) 01/18/2022   ALT 15 01/18/2022   AST 15 01/18/2022   NA 139 01/18/2022   K 3.7 01/18/2022   CL 105 01/18/2022   CREATININE 1.02 01/18/2022   BUN 10 01/18/2022   CO2 27 01/18/2022   TSH 0.64 01/18/2022   HGBA1C 5.3 01/18/2022    Assessment/Plan:  [name] is a 51 y.o. Black or African American [2] female with  has a past medical history of ABDOMINAL TENDERNESS (06/10/2010), ANEMIA-IRON DEFICIENCY (06/10/2010), CHEST PAIN (06/10/2010), HYPERTENSION (06/10/2010), MENORRHALGIA (06/10/2010), and NEPHROLITHIASIS, HX OF (06/10/2010).  Encounter for well adult exam with abnormal findings Age and sex appropriate education and counseling updated with regular exercise and diet Referrals for preventative services - declines hep c screen Immunizations addressed - declines covid booster, shiingrix Smoking counseling  - none needed Evidence for depression or other mood disorder - none significant Most recent labs reviewed. I have personally reviewed and have noted: 1) the patient's medical and social history 2) The patient's current medications and supplements 3) The patient's height, weight, and BMI have been recorded in the chart   Vitamin D deficiency Last vitamin D Lab Results  Component Value Date   VD25OH 25.20 (L) 01/18/2022   Low, to start oral replacement   Hyperglycemia Lab Results  Component Value Date   HGBA1C 5.3 01/18/2022   Stable, pt to continue current medical treatment  - diet, exercise, wt control   HLD (hyperlipidemia) Lab Results  Component Value Date   LDLCALC 120 (H) 01/18/2022   Uncontrolled, goal ldl < 100, pt to continue low chol diet, declines statin   Essential hypertension BP Readings from Last 3 Encounters:  01/18/22 (!) 172/108  11/28/20 (!) 142/84  08/23/19 (!) 160/90   Uncontrolled, has been out of BP med for 3 days, pt to restart medical treatment lotrel 10-40 mg qd, hct 12.5 qd  Followup: Return in about 1 year (around 01/19/2023).  Cathlean Cower, MD 01/18/2022 9:49 PM [facility]

## 2022-01-18 NOTE — Assessment & Plan Note (Signed)
Lab Results  Component Value Date   LDLCALC 120 (H) 01/18/2022   Uncontrolled, goal ldl < 100, pt to continue low chol diet, declines statin

## 2022-01-26 ENCOUNTER — Encounter: Payer: BC Managed Care – PPO | Admitting: Internal Medicine

## 2022-07-19 ENCOUNTER — Other Ambulatory Visit: Payer: Self-pay | Admitting: Internal Medicine

## 2022-07-19 DIAGNOSIS — E538 Deficiency of other specified B group vitamins: Secondary | ICD-10-CM

## 2022-07-19 DIAGNOSIS — E559 Vitamin D deficiency, unspecified: Secondary | ICD-10-CM

## 2022-07-19 DIAGNOSIS — E7800 Pure hypercholesterolemia, unspecified: Secondary | ICD-10-CM

## 2022-07-19 DIAGNOSIS — I10 Essential (primary) hypertension: Secondary | ICD-10-CM

## 2022-07-19 DIAGNOSIS — R739 Hyperglycemia, unspecified: Secondary | ICD-10-CM

## 2022-08-10 ENCOUNTER — Encounter: Payer: Self-pay | Admitting: Internal Medicine

## 2022-08-10 ENCOUNTER — Ambulatory Visit: Payer: BC Managed Care – PPO | Admitting: Internal Medicine

## 2022-08-10 VITALS — BP 138/84 | HR 90 | Temp 98.2°F | Ht 62.0 in | Wt 254.0 lb

## 2022-08-10 DIAGNOSIS — I10 Essential (primary) hypertension: Secondary | ICD-10-CM

## 2022-08-10 DIAGNOSIS — R058 Other specified cough: Secondary | ICD-10-CM | POA: Diagnosis not present

## 2022-08-10 DIAGNOSIS — T464X5A Adverse effect of angiotensin-converting-enzyme inhibitors, initial encounter: Secondary | ICD-10-CM | POA: Diagnosis not present

## 2022-08-10 MED ORDER — AMLODIPINE-OLMESARTAN 10-40 MG PO TABS: 1.0000 | ORAL_TABLET | Freq: Every day | ORAL | 3 refills | Status: DC

## 2022-08-10 NOTE — Assessment & Plan Note (Signed)
D/c Benazepril Start Olmes-Amlod 1 a day

## 2022-08-10 NOTE — Progress Notes (Signed)
Subjective:  Patient ID: [name], female    DOB: [birth year]  Age: 52 y.o. MRN: [id number]  CC: Cough (Congestion started the beginning of November )   HPI [name] presents for cough since a URI since Nov: dry cough, random... It has happened before after URIs. F/u on HTN   Outpatient Medications Prior to Visit  Medication Sig Dispense Refill   aspirin 81 MG EC tablet Take 81 mg by mouth daily.     Cholecalciferol (VITAMIN D3) 50 MCG (2000 UT) TABS Take 2 tablets by mouth daily.     hydrochlorothiazide (MICROZIDE) 12.5 MG capsule Take 1 capsule (12.5 mg total) by mouth daily. 90 capsule 3   Multiple Vitamin (MULTI VITAMIN) TABS 1 tablet     amLODipine-benazepril (LOTREL) 10-40 MG capsule Take 1 capsule by mouth daily. 90 capsule 3   No facility-administered medications prior to visit.    ROS: Review of Systems  Constitutional:  Negative for activity change, appetite change, chills, fatigue and unexpected weight change.  HENT:  Negative for congestion, mouth sores and sinus pressure.   Eyes:  Negative for visual disturbance.  Respiratory:  Positive for cough. Negative for chest tightness.   Gastrointestinal:  Negative for abdominal pain and nausea.  Genitourinary:  Negative for difficulty urinating, frequency and vaginal pain.  Musculoskeletal:  Negative for back pain and gait problem.  Skin:  Negative for pallor and rash.  Neurological:  Negative for dizziness, tremors, weakness, numbness and headaches.  Psychiatric/Behavioral:  Negative for confusion and sleep disturbance.     Objective:  BP 138/84 (BP Location: Left Arm, Patient Position: Sitting, Cuff Size: Normal)   Pulse 90   Temp 98.2 F (36.8 C) (Oral)   Ht '5\' 2"'$  (1.575 m)   Wt 254 lb (115.2 kg)   SpO2 100%   BMI 46.46 kg/m   BP Readings from Last 3 Encounters:  08/10/22 138/84  01/18/22 (!) 172/108  11/28/20 (!) 142/84    Wt Readings from Last 3 Encounters:  08/10/22 254 lb (115.2 kg)   01/18/22 253 lb (114.8 kg)  11/28/20 253 lb 3.2 oz (114.9 kg)    Physical Exam Constitutional:      General: She is not in acute distress.    Appearance: She is well-developed. She is obese.  HENT:     Head: Normocephalic.     Right Ear: External ear normal.     Left Ear: External ear normal.     Nose: Nose normal.  Eyes:     General:        Right eye: No discharge.        Left eye: No discharge.     Conjunctiva/sclera: Conjunctivae normal.     Pupils: Pupils are equal, round, and reactive to light.  Neck:     Thyroid: No thyromegaly.     Vascular: No JVD.     Trachea: No tracheal deviation.  Cardiovascular:     Rate and Rhythm: Normal rate and regular rhythm.     Heart sounds: Normal heart sounds.  Pulmonary:     Effort: No respiratory distress.     Breath sounds: No stridor. No wheezing.  Abdominal:     General: Bowel sounds are normal. There is no distension.     Palpations: Abdomen is soft. There is no mass.     Tenderness: There is no abdominal tenderness. There is no guarding or rebound.  Musculoskeletal:        General: No tenderness.  Cervical back: Normal range of motion and neck supple. No rigidity.  Lymphadenopathy:     Cervical: No cervical adenopathy.  Skin:    Findings: No erythema or rash.  Neurological:     Mental Status: She is oriented to person, place, and time.     Cranial Nerves: No cranial nerve deficit.     Motor: No abnormal muscle tone.     Coordination: Coordination normal.     Deep Tendon Reflexes: Reflexes normal.  Psychiatric:        Behavior: Behavior normal.        Thought Content: Thought content normal.        Judgment: Judgment normal.     Lab Results  Component Value Date   WBC 6.3 01/18/2022   HGB 14.1 01/18/2022   HCT 41.6 01/18/2022   PLT 244.0 01/18/2022   GLUCOSE 87 01/18/2022   CHOL 183 01/18/2022   TRIG 65.0 01/18/2022   HDL 50.40 01/18/2022   LDLCALC 120 (H) 01/18/2022   ALT 15 01/18/2022   AST 15  01/18/2022   NA 139 01/18/2022   K 3.7 01/18/2022   CL 105 01/18/2022   CREATININE 1.02 01/18/2022   BUN 10 01/18/2022   CO2 27 01/18/2022   TSH 0.64 01/18/2022   HGBA1C 5.3 01/18/2022    DG Ankle Complete Right  Result Date: 06/17/2016 CLINICAL DATA:  Acute right ankle pain after twisting injury. Initial encounter. EXAM: RIGHT ANKLE - COMPLETE 3+ VIEW COMPARISON:  None. FINDINGS: Soft tissue swelling with subcutaneous reticulation. No fracture deformity or subluxation. No ankle joint effusion. IMPRESSION: Soft tissue swelling without fracture. Electronically Signed   By: Monte Fantasia M.D.   On: 06/17/2016 08:15   DG Knee Complete 4 Views Right  Result Date: 06/17/2016 CLINICAL DATA:  Twisting injury of the leg 7 days ago.  No swelling. EXAM: RIGHT KNEE - COMPLETE 4+ VIEW COMPARISON:  None. FINDINGS: No evidence of fracture, dislocation, or joint effusion. No evidence of arthropathy or other focal bone abnormality. Soft tissues are unremarkable. IMPRESSION: No acute osseous injury of the right knee. Electronically Signed   By: Kathreen Devoid   On: 06/17/2016 08:15    Assessment & Plan:   Problem List Items Addressed This Visit     Essential hypertension    D/c Benazepril Start Olmes-Amlod 1 a day      Relevant Medications   amLODipine-olmesartan (AZOR) 10-40 MG tablet   Cough due to ACE inhibitor - Primary    D/c Benazepril Start Olmes-Amlod 1 a day         Meds ordered this encounter  Medications   amLODipine-olmesartan (AZOR) 10-40 MG tablet    Sig: Take 1 tablet by mouth daily.    Dispense:  90 tablet    Refill:  3      Follow-up: Return in about 3 months (around 11/09/2022) for a follow-up visit.  Walker Kehr, MD

## 2022-08-23 DIAGNOSIS — Z1231 Encounter for screening mammogram for malignant neoplasm of breast: Secondary | ICD-10-CM | POA: Diagnosis not present

## 2022-08-23 DIAGNOSIS — Z6841 Body Mass Index (BMI) 40.0 and over, adult: Secondary | ICD-10-CM | POA: Diagnosis not present

## 2022-08-23 DIAGNOSIS — Z01419 Encounter for gynecological examination (general) (routine) without abnormal findings: Secondary | ICD-10-CM | POA: Diagnosis not present

## 2023-02-11 ENCOUNTER — Other Ambulatory Visit: Payer: Self-pay

## 2023-02-11 ENCOUNTER — Other Ambulatory Visit: Payer: Self-pay | Admitting: Internal Medicine

## 2023-03-19 ENCOUNTER — Other Ambulatory Visit: Payer: Self-pay | Admitting: Internal Medicine

## 2023-05-10 ENCOUNTER — Ambulatory Visit: Payer: BC Managed Care – PPO | Admitting: Internal Medicine

## 2023-05-10 ENCOUNTER — Encounter: Payer: Self-pay | Admitting: Internal Medicine

## 2023-05-10 VITALS — BP 136/82 | HR 63 | Temp 98.0°F | Ht 62.0 in | Wt 266.0 lb

## 2023-05-10 DIAGNOSIS — E559 Vitamin D deficiency, unspecified: Secondary | ICD-10-CM | POA: Diagnosis not present

## 2023-05-10 DIAGNOSIS — R739 Hyperglycemia, unspecified: Secondary | ICD-10-CM

## 2023-05-10 DIAGNOSIS — D509 Iron deficiency anemia, unspecified: Secondary | ICD-10-CM

## 2023-05-10 DIAGNOSIS — E538 Deficiency of other specified B group vitamins: Secondary | ICD-10-CM

## 2023-05-10 DIAGNOSIS — E7800 Pure hypercholesterolemia, unspecified: Secondary | ICD-10-CM | POA: Diagnosis not present

## 2023-05-10 DIAGNOSIS — Z0000 Encounter for general adult medical examination without abnormal findings: Secondary | ICD-10-CM | POA: Diagnosis not present

## 2023-05-10 DIAGNOSIS — L989 Disorder of the skin and subcutaneous tissue, unspecified: Secondary | ICD-10-CM

## 2023-05-10 DIAGNOSIS — I10 Essential (primary) hypertension: Secondary | ICD-10-CM

## 2023-05-10 DIAGNOSIS — Z0001 Encounter for general adult medical examination with abnormal findings: Secondary | ICD-10-CM

## 2023-05-10 LAB — URINALYSIS, ROUTINE W REFLEX MICROSCOPIC
Bilirubin Urine: NEGATIVE
Hgb urine dipstick: NEGATIVE
Ketones, ur: NEGATIVE
Leukocytes,Ua: NEGATIVE
Nitrite: POSITIVE — AB
RBC / HPF: NONE SEEN (ref 0–?)
Specific Gravity, Urine: 1.02 (ref 1.000–1.030)
Total Protein, Urine: NEGATIVE
Urine Glucose: NEGATIVE
Urobilinogen, UA: 0.2 (ref 0.0–1.0)
pH: 6 (ref 5.0–8.0)

## 2023-05-10 LAB — LIPID PANEL
Cholesterol: 195 mg/dL (ref 0–200)
HDL: 59.3 mg/dL (ref >39.00)
LDL Cholesterol: 120 mg/dL — ABNORMAL HIGH (ref 0–99)
NonHDL: 135.65
Total CHOL/HDL Ratio: 3
Triglycerides: 78 mg/dL (ref 0.0–149.0)
VLDL: 15.6 mg/dL (ref 0.0–40.0)

## 2023-05-10 LAB — BASIC METABOLIC PANEL
BUN: 11 mg/dL (ref 6–23)
CO2: 27 meq/L (ref 19–32)
Calcium: 9.3 mg/dL (ref 8.4–10.5)
Chloride: 102 meq/L (ref 96–112)
Creatinine, Ser: 1.08 mg/dL (ref 0.40–1.20)
GFR: 58.87 mL/min — ABNORMAL LOW (ref >60.00)
Glucose, Bld: 89 mg/dL (ref 70–99)
Potassium: 3.9 meq/L (ref 3.5–5.1)
Sodium: 137 meq/L (ref 135–145)

## 2023-05-10 LAB — MICROALBUMIN / CREATININE URINE RATIO
Creatinine,U: 108.7 mg/dL
Microalb Creat Ratio: 0.6 mg/g (ref 0.0–30.0)
Microalb, Ur: <0.7 mg/dL (ref 0.0–1.9)

## 2023-05-10 LAB — CBC WITH DIFFERENTIAL/PLATELET
Basophils Absolute: 0 10*3/uL (ref 0.0–0.1)
Basophils Relative: 0.7 % (ref 0.0–3.0)
Eosinophils Absolute: 0.2 10*3/uL (ref 0.0–0.7)
Eosinophils Relative: 3.4 % (ref 0.0–5.0)
HCT: 43.4 % (ref 36.0–46.0)
Hemoglobin: 14.4 g/dL (ref 12.0–15.0)
Lymphocytes Relative: 38.7 % (ref 12.0–46.0)
Lymphs Abs: 2.3 10*3/uL (ref 0.7–4.0)
MCHC: 33.1 g/dL (ref 30.0–36.0)
MCV: 90.1 fL (ref 78.0–100.0)
Monocytes Absolute: 0.6 10*3/uL (ref 0.1–1.0)
Monocytes Relative: 10.7 % (ref 3.0–12.0)
Neutro Abs: 2.8 10*3/uL (ref 1.4–7.7)
Neutrophils Relative %: 46.5 % (ref 43.0–77.0)
Platelets: 261 10*3/uL (ref 150.0–400.0)
RBC: 4.82 Mil/uL (ref 3.87–5.11)
RDW: 13.8 % (ref 11.5–15.5)
WBC: 5.9 10*3/uL (ref 4.0–10.5)

## 2023-05-10 LAB — HEPATIC FUNCTION PANEL
ALT: 26 U/L (ref 0–35)
AST: 21 U/L (ref 0–37)
Albumin: 4.1 g/dL (ref 3.5–5.2)
Alkaline Phosphatase: 73 U/L (ref 39–117)
Bilirubin, Direct: 0.1 mg/dL (ref 0.0–0.3)
Total Bilirubin: 0.5 mg/dL (ref 0.2–1.2)
Total Protein: 7.4 g/dL (ref 6.0–8.3)

## 2023-05-10 LAB — HEMOGLOBIN A1C: Hgb A1c MFr Bld: 5.5 % (ref 4.6–6.5)

## 2023-05-10 LAB — VITAMIN B12: Vitamin B-12: 192 pg/mL — ABNORMAL LOW (ref 211–911)

## 2023-05-10 LAB — VITAMIN D 25 HYDROXY (VIT D DEFICIENCY, FRACTURES): VITD: 16.63 ng/mL — ABNORMAL LOW (ref 30.00–100.00)

## 2023-05-10 LAB — TSH: TSH: 0.85 u[IU]/mL (ref 0.35–5.50)

## 2023-05-10 MED ORDER — HYDROCHLOROTHIAZIDE 25 MG PO TABS: 25.0000 mg | ORAL_TABLET | Freq: Every day | ORAL | 3 refills | Status: DC

## 2023-05-10 MED ORDER — AMLODIPINE-OLMESARTAN 5-40 MG PO TABS: 1.0000 | ORAL_TABLET | Freq: Every day | ORAL | 3 refills | Status: DC

## 2023-05-10 NOTE — Patient Instructions (Signed)
Ok to reduce the azor to the 5 -40 mg per day  Ok to increase the HCT to 25 mg per day  Please continue all other medications as before, and refills have been done if requested.  Please have the pharmacy call with any other refills you may need.  Please continue your efforts at being more active, low cholesterol diet, and weight control.  You are otherwise up to date with prevention measures today.  Please keep your appointments with your specialists as you may have planned  You will be contacted regarding the referral for: Dermatology  Please go to the LAB at the blood drawing area for the tests to be done  You will be contacted by phone if any changes need to be made immediately.  Otherwise, you will receive a letter about your results with an explanation, but please check with MyChart first.  Please remember to sign up for MyChart if you have not done so, as this will be important to you in the future with finding out test results, communicating by private email, and scheduling acute appointments online when needed.  Please make an Appointment to return in 6 months, or sooner if needed

## 2023-05-10 NOTE — Progress Notes (Signed)
Patient ID: [name], female   DOB: [birth year], 52 y.o.   MRN: [id number]         Chief Complaint:: wellness exam and Medication Refill (Also has some glass in left arm ) , htn, edema, iron def anemia, hld, hyperglycemia, low vit d        HPI:  [name] is a 52 y.o. female here for wellness exam; for shingrix at pharmacy, declines hep c screening, o/w up to date                        Also has lesion to left forearm approx 2 cm posteriorly from the left olecranon.  Has some mild worsening swelling to the legs for the last few months, now always resolved at night.  Pt denies chest pain, increased sob or doe, wheezing, orthopnea, PND, palpitations, dizziness or syncope.  Pt denies polydipsia, polyuria, or new focal neuro s/s.    Pt denies fever, wt loss, night sweats, loss of appetite, or other constitutional symptoms  No overt bleeding or bruising.    Wt Readings from Last 3 Encounters:  05/10/23 266 lb (120.7 kg)  08/10/22 254 lb (115.2 kg)  01/18/22 253 lb (114.8 kg)   BP Readings from Last 3 Encounters:  05/10/23 136/82  08/10/22 138/84  01/18/22 (!) 172/108   Immunization History  Administered Date(s) Administered   PFIZER(Purple Top)SARS-COV-2 Vaccination 04/02/2020, 04/23/2020   Td 06/10/2010   Tdap 11/28/2020   Health Maintenance Due  Topic Date Due   Hepatitis C Screening  Never done   Zoster Vaccines- Shingrix (1 of 2) Never done      Past Medical History:  Diagnosis Date   ABDOMINAL TENDERNESS 06/10/2010   ANEMIA-IRON DEFICIENCY 06/10/2010   CHEST PAIN 06/10/2010   HYPERTENSION 06/10/2010   MENORRHALGIA 06/10/2010   NEPHROLITHIASIS, HX OF 06/10/2010   Past Surgical History:  Procedure Laterality Date   CHOLECYSTECTOMY     TOTAL ABDOMINAL HYSTERECTOMY      reports that she has never smoked. She has never used smokeless tobacco. She reports current alcohol use. She reports that she does not use drugs. family history includes Diabetes in an other family  member; Hypertension in some other family members; Stroke in some other family members. Allergies  Allergen Reactions   Benazepril     cough   Current Outpatient Medications on File Prior to Visit  Medication Sig Dispense Refill   amLODipine-olmesartan (AZOR) 10-40 MG tablet Take 1 tablet by mouth daily. 90 tablet 3   aspirin 81 MG EC tablet Take 81 mg by mouth daily.     Cholecalciferol (VITAMIN D3) 50 MCG (2000 UT) TABS Take 2 tablets by mouth daily.     Multiple Vitamin (MULTI VITAMIN) TABS 1 tablet     Olmesartan Medoxomil (BENICAR PO) 10/40 mg 1 daily     No current facility-administered medications on file prior to visit.        ROS:  All others reviewed and negative.  Objective        PE:  BP 136/82 (BP Location: Right Arm, Patient Position: Sitting, Cuff Size: Normal)   Pulse 63   Temp 98 F (36.7 C) (Oral)   Ht 5\' 2"  (1.575 m)   Wt 266 lb (120.7 kg)   SpO2 98%   BMI 48.65 kg/m                 Constitutional: Pt appears in NAD  HENT: Head: NCAT.                Right Ear: External ear normal.                 Left Ear: External ear normal.                Eyes: . Pupils are equal, round, and reactive to light. Conjunctivae and EOM are normal               Nose: without d/c or deformity               Neck: Neck supple. Gross normal ROM               Cardiovascular: Normal rate and regular rhythm.                 Pulmonary/Chest: Effort normal and breath sounds without rales or wheezing.                Abd:  Soft, NT, ND, + BS, no organomegaly               Neurological: Pt is alert. At baseline orientation, motor grossly intact               Skin: Skin is warm., LE edema - none, left arm post arm approx 2 cm distal from the olecranon is tender subcutaneous small hard mass possible glass               Psychiatric: Pt behavior is normal without agitation   Micro: none  Cardiac tracings I have personally interpreted today:  none  Pertinent Radiological  findings (summarize): none   Lab Results  Component Value Date   WBC 5.9 05/10/2023   HGB 14.4 05/10/2023   HCT 43.4 05/10/2023   PLT 261.0 05/10/2023   GLUCOSE 89 05/10/2023   CHOL 195 05/10/2023   TRIG 78.0 05/10/2023   HDL 59.30 05/10/2023   LDLCALC 120 (H) 05/10/2023   ALT 26 05/10/2023   AST 21 05/10/2023   NA 137 05/10/2023   K 3.9 05/10/2023   CL 102 05/10/2023   CREATININE 1.08 05/10/2023   BUN 11 05/10/2023   CO2 27 05/10/2023   TSH 0.85 05/10/2023   HGBA1C 5.5 05/10/2023   MICROALBUR <0.7 05/10/2023   Assessment/Plan:  [name] is a 52 y.o. Black or African American [2] female with  has a past medical history of ABDOMINAL TENDERNESS (06/10/2010), ANEMIA-IRON DEFICIENCY (06/10/2010), CHEST PAIN (06/10/2010), HYPERTENSION (06/10/2010), MENORRHALGIA (06/10/2010), and NEPHROLITHIASIS, HX OF (06/10/2010).  Encounter for well adult exam with abnormal findings Age and sex appropriate education and counseling updated with regular exercise and diet Referrals for preventative services - declines hep c screen Immunizations addressed - for shignrix at pharmacy Smoking counseling  - none needed Evidence for depression or other mood disorder - none significant Most recent labs reviewed. I have personally reviewed and have noted: 1) the patient's medical and social history 2) The patient's current medications and supplements 3) The patient's height, weight, and BMI have been recorded in the chart   [facility] Lab Results  Component Value Date   WBC 5.9 05/10/2023   HGB 14.4 05/10/2023   HCT 43.4 05/10/2023   MCV 90.1 05/10/2023   PLT 261.0 05/10/2023  Stable, for f/u iron level  Essential hypertension BP Readings from Last 3 Encounters:  05/10/23 136/82  08/10/22 138/84  01/18/22 (!) 172/108   Stable, pt to continue  medical treatment except to decrease the azor to 5 40 mg every day, and increased hct to 25 qd   HLD (hyperlipidemia) Lab Results   Component Value Date   LDLCALC 120 (H) 05/10/2023   Uncontrolled, goal ldl < 100, pt for lower chol diet, declines statin or other tx for now  Hyperglycemia Lab Results  Component Value Date   HGBA1C 5.5 05/10/2023   Stable, pt to continue current medical treatment  - diet,w t control   Vitamin D deficiency Last vitamin D Lab Results  Component Value Date   VD25OH 16.63 (L) 05/10/2023   Low, to start oral replacement   B12 deficiency Lab Results  Component Value Date   VITAMINB12 192 (L) 05/10/2023   Low, to start oral replacement - b12 1000 mcg qd   Skin lesion of left arm Likely foreign body glass to left arm  - for derm referral  Followup: Return in about 6 months (around 11/08/2023).  Oliver Barre, MD 05/13/2023 9:23 PM [facility]

## 2023-05-13 ENCOUNTER — Encounter: Payer: Self-pay | Admitting: Internal Medicine

## 2023-05-13 NOTE — Assessment & Plan Note (Signed)
Likely foreign body glass to left arm  - for derm referral

## 2023-05-13 NOTE — Assessment & Plan Note (Signed)
BP Readings from Last 3 Encounters:  05/10/23 136/82  08/10/22 138/84  01/18/22 (!) 172/108   Stable, pt to continue medical treatment except to decrease the azor to 5 40 mg every day, and increased hct to 25 qd

## 2023-05-13 NOTE — Assessment & Plan Note (Signed)
Lab Results  Component Value Date   LDLCALC 120 (H) 05/10/2023   Uncontrolled, goal ldl < 100, pt for lower chol diet, declines statin or other tx for now

## 2023-05-13 NOTE — Assessment & Plan Note (Signed)
Age and sex appropriate education and counseling updated with regular exercise and diet Referrals for preventative services - declines hep c screen Immunizations addressed - for shignrix at pharmacy Smoking counseling  - none needed Evidence for depression or other mood disorder - none significant Most recent labs reviewed. I have personally reviewed and have noted: 1) the patient's medical and social history 2) The patient's current medications and supplements 3) The patient's height, weight, and BMI have been recorded in the chart

## 2023-05-13 NOTE — Assessment & Plan Note (Signed)
Lab Results  Component Value Date   WBC 5.9 05/10/2023   HGB 14.4 05/10/2023   HCT 43.4 05/10/2023   MCV 90.1 05/10/2023   PLT 261.0 05/10/2023  Stable, for f/u iron level

## 2023-05-13 NOTE — Assessment & Plan Note (Signed)
Last vitamin D Lab Results  Component Value Date   VD25OH 16.63 (L) 05/10/2023   Low, to start oral replacement

## 2023-05-13 NOTE — Assessment & Plan Note (Signed)
Lab Results  Component Value Date   HGBA1C 5.5 05/10/2023   Stable, pt to continue current medical treatment  - diet,w t control

## 2023-05-13 NOTE — Assessment & Plan Note (Signed)
Lab Results  Component Value Date   VITAMINB12 192 (L) 05/10/2023   Low, to start oral replacement - b12 1000 mcg qd

## 2023-06-20 DIAGNOSIS — M795 Residual foreign body in soft tissue: Secondary | ICD-10-CM | POA: Diagnosis not present

## 2023-12-26 ENCOUNTER — Telehealth: Payer: Self-pay

## 2023-12-26 DIAGNOSIS — E538 Deficiency of other specified B group vitamins: Secondary | ICD-10-CM

## 2023-12-26 DIAGNOSIS — R739 Hyperglycemia, unspecified: Secondary | ICD-10-CM

## 2023-12-26 DIAGNOSIS — D509 Iron deficiency anemia, unspecified: Secondary | ICD-10-CM

## 2023-12-26 DIAGNOSIS — E559 Vitamin D deficiency, unspecified: Secondary | ICD-10-CM

## 2023-12-26 DIAGNOSIS — E7800 Pure hypercholesterolemia, unspecified: Secondary | ICD-10-CM

## 2023-12-26 NOTE — Telephone Encounter (Signed)
 Ok labs are ordered

## 2023-12-26 NOTE — Telephone Encounter (Signed)
 Copied from CRM [id number]. Topic: General - Other >> Dec 23, 2023 10:23 AM Annelle Kiel wrote: Reason for CRM: patient is requesting lab orders before her physical

## 2023-12-27 NOTE — Telephone Encounter (Signed)
 Called and let Pt know

## 2024-01-20 ENCOUNTER — Other Ambulatory Visit (INDEPENDENT_AMBULATORY_CARE_PROVIDER_SITE_OTHER)

## 2024-01-20 DIAGNOSIS — E538 Deficiency of other specified B group vitamins: Secondary | ICD-10-CM | POA: Diagnosis not present

## 2024-01-20 DIAGNOSIS — D509 Iron deficiency anemia, unspecified: Secondary | ICD-10-CM

## 2024-01-20 DIAGNOSIS — E559 Vitamin D deficiency, unspecified: Secondary | ICD-10-CM | POA: Diagnosis not present

## 2024-01-20 DIAGNOSIS — R739 Hyperglycemia, unspecified: Secondary | ICD-10-CM | POA: Diagnosis not present

## 2024-01-20 DIAGNOSIS — E7800 Pure hypercholesterolemia, unspecified: Secondary | ICD-10-CM | POA: Diagnosis not present

## 2024-01-20 LAB — BASIC METABOLIC PANEL WITH GFR
BUN: 11 mg/dL (ref 6–23)
CO2: 27 meq/L (ref 19–32)
Calcium: 9.2 mg/dL (ref 8.4–10.5)
Chloride: 104 meq/L (ref 96–112)
Creatinine, Ser: 1.04 mg/dL (ref 0.40–1.20)
GFR: 61.29 mL/min (ref >60.00)
Glucose, Bld: 88 mg/dL (ref 70–99)
Potassium: 3.5 meq/L (ref 3.5–5.1)
Sodium: 138 meq/L (ref 135–145)

## 2024-01-20 LAB — FERRITIN: Ferritin: 150.9 ng/mL (ref 10.0–291.0)

## 2024-01-20 LAB — HEPATIC FUNCTION PANEL
ALT: 21 U/L (ref 0–35)
AST: 18 U/L (ref 0–37)
Albumin: 3.9 g/dL (ref 3.5–5.2)
Alkaline Phosphatase: 61 U/L (ref 39–117)
Bilirubin, Direct: 0.1 mg/dL (ref 0.0–0.3)
Total Bilirubin: 0.5 mg/dL (ref 0.2–1.2)
Total Protein: 7 g/dL (ref 6.0–8.3)

## 2024-01-20 LAB — VITAMIN D 25 HYDROXY (VIT D DEFICIENCY, FRACTURES): VITD: 11.97 ng/mL — ABNORMAL LOW (ref 30.00–100.00)

## 2024-01-20 LAB — IBC PANEL
Iron: 89 ug/dL (ref 42–145)
Saturation Ratios: 29.8 % (ref 20.0–50.0)
TIBC: 298.2 ug/dL (ref 250.0–450.0)
Transferrin: 213 mg/dL (ref 212.0–360.0)

## 2024-01-20 LAB — LIPID PANEL
Cholesterol: 180 mg/dL (ref 0–200)
HDL: 44.5 mg/dL (ref >39.00)
LDL Cholesterol: 108 mg/dL — ABNORMAL HIGH (ref 0–99)
NonHDL: 135.19
Total CHOL/HDL Ratio: 4
Triglycerides: 135 mg/dL (ref 0.0–149.0)
VLDL: 27 mg/dL (ref 0.0–40.0)

## 2024-01-20 LAB — VITAMIN B12: Vitamin B-12: 275 pg/mL (ref 211–911)

## 2024-01-20 LAB — HEMOGLOBIN A1C: Hgb A1c MFr Bld: 5.5 % (ref 4.6–6.5)

## 2024-01-23 DIAGNOSIS — Z1272 Encounter for screening for malignant neoplasm of vagina: Secondary | ICD-10-CM | POA: Diagnosis not present

## 2024-01-23 DIAGNOSIS — Z1231 Encounter for screening mammogram for malignant neoplasm of breast: Secondary | ICD-10-CM | POA: Diagnosis not present

## 2024-01-23 DIAGNOSIS — Z1151 Encounter for screening for human papillomavirus (HPV): Secondary | ICD-10-CM | POA: Diagnosis not present

## 2024-01-23 DIAGNOSIS — Z6841 Body Mass Index (BMI) 40.0 and over, adult: Secondary | ICD-10-CM | POA: Diagnosis not present

## 2024-01-23 DIAGNOSIS — Z01419 Encounter for gynecological examination (general) (routine) without abnormal findings: Secondary | ICD-10-CM | POA: Diagnosis not present

## 2024-01-24 ENCOUNTER — Encounter: Payer: Self-pay | Admitting: Internal Medicine

## 2024-01-24 ENCOUNTER — Ambulatory Visit (INDEPENDENT_AMBULATORY_CARE_PROVIDER_SITE_OTHER): Admitting: Internal Medicine

## 2024-01-24 VITALS — BP 130/84 | HR 70 | Temp 97.5°F | Ht 62.0 in | Wt 253.0 lb

## 2024-01-24 DIAGNOSIS — E7800 Pure hypercholesterolemia, unspecified: Secondary | ICD-10-CM

## 2024-01-24 DIAGNOSIS — Z1159 Encounter for screening for other viral diseases: Secondary | ICD-10-CM

## 2024-01-24 DIAGNOSIS — M79672 Pain in left foot: Secondary | ICD-10-CM

## 2024-01-24 DIAGNOSIS — Z0000 Encounter for general adult medical examination without abnormal findings: Secondary | ICD-10-CM | POA: Diagnosis not present

## 2024-01-24 DIAGNOSIS — Z0001 Encounter for general adult medical examination with abnormal findings: Secondary | ICD-10-CM

## 2024-01-24 DIAGNOSIS — R739 Hyperglycemia, unspecified: Secondary | ICD-10-CM

## 2024-01-24 DIAGNOSIS — E538 Deficiency of other specified B group vitamins: Secondary | ICD-10-CM

## 2024-01-24 DIAGNOSIS — E559 Vitamin D deficiency, unspecified: Secondary | ICD-10-CM

## 2024-01-24 DIAGNOSIS — I10 Essential (primary) hypertension: Secondary | ICD-10-CM

## 2024-01-24 DIAGNOSIS — M79671 Pain in right foot: Secondary | ICD-10-CM

## 2024-01-24 MED ORDER — AMLODIPINE-OLMESARTAN 5-40 MG PO TABS: 1.0000 | ORAL_TABLET | Freq: Every day | ORAL | 3 refills | Status: AC

## 2024-01-24 MED ORDER — HYDROCHLOROTHIAZIDE 25 MG PO TABS: 25.0000 mg | ORAL_TABLET | Freq: Every day | ORAL | 3 refills | Status: AC

## 2024-01-24 MED ORDER — HYDROCHLOROTHIAZIDE 25 MG PO TABS: 25.0000 mg | ORAL_TABLET | Freq: Every day | ORAL | 3 refills | Status: DC

## 2024-01-24 MED ORDER — AMLODIPINE-OLMESARTAN 5-40 MG PO TABS: 1.0000 | ORAL_TABLET | Freq: Every day | ORAL | 3 refills | Status: DC

## 2024-01-24 NOTE — Progress Notes (Signed)
 Patient ID: [name], female   DOB: 09-21-1970, 53 y.o.   MRN: [id number]         Chief Complaint:: wellness exam and htn, hld, hyperglycemia, low vit d       HPI:  [name] is a 53 y.o. female here for wellness exam; for hep c screen, for shingrix at pharmacy, o/w up to date                        Also has persistent > 6 mo bilateral feet pain, with mild intermittent swelling, asking for referral.  Pt denies chest pain, increased sob or doe, wheezing, orthopnea, PND, increased LE swelling, palpitations, dizziness or syncope.   Pt denies polydipsia, polyuria, or new focal neuro s/s.  Pt denies fever, wt loss, night sweats, loss of appetite, or other constitutional symptoms    Wt down more than 10 lbs with better diet.   Wt Readings from Last 3 Encounters:  01/24/24 253 lb (114.8 kg)  05/10/23 266 lb (120.7 kg)  08/10/22 254 lb (115.2 kg)   BP Readings from Last 3 Encounters:  01/24/24 130/84  05/10/23 136/82  08/10/22 138/84   Immunization History  Administered Date(s) Administered   PFIZER(Purple Top)SARS-COV-2 Vaccination 04/02/2020, 04/23/2020   Td 06/10/2010   Tdap 11/28/2020   Health Maintenance Due  Topic Date Due   Hepatitis C Screening  Never done   Zoster Vaccines- Shingrix (1 of 2) Never done      Past Medical History:  Diagnosis Date   ABDOMINAL TENDERNESS 06/10/2010   ANEMIA-IRON DEFICIENCY 06/10/2010   CHEST PAIN 06/10/2010   HYPERTENSION 06/10/2010   MENORRHALGIA 06/10/2010   NEPHROLITHIASIS, HX OF 06/10/2010   Past Surgical History:  Procedure Laterality Date   CHOLECYSTECTOMY     TOTAL ABDOMINAL HYSTERECTOMY      reports that she has never smoked. She has never used smokeless tobacco. She reports current alcohol use. She reports that she does not use drugs. family history includes Diabetes in an other family member; Hypertension in some other family members; Stroke in some other family members. Allergies  Allergen Reactions   Benazepril       cough   Current Outpatient Medications on File Prior to Visit  Medication Sig Dispense Refill   aspirin 81 MG EC tablet Take 81 mg by mouth daily.     Cholecalciferol (VITAMIN D3) 50 MCG (2000 UT) TABS Take 2 tablets by mouth daily.     Multiple Vitamin (MULTI VITAMIN) TABS 1 tablet     No current facility-administered medications on file prior to visit.        ROS:  All others reviewed and negative.  Objective        PE:  BP 130/84 (BP Location: Right Arm, Patient Position: Sitting, Cuff Size: Normal)   Pulse 70   Temp (!) 97.5 F (36.4 C) (Oral)   Ht 5' 2 (1.575 m)   Wt 253 lb (114.8 kg)   SpO2 98%   BMI 46.27 kg/m                 Constitutional: Pt appears in NAD               HENT: Head: NCAT.                Right Ear: External ear normal.                 Left Ear: External ear  normal.                Eyes: . Pupils are equal, round, and reactive to light. Conjunctivae and EOM are normal               Nose: without d/c or deformity               Neck: Neck supple. Gross normal ROM               Cardiovascular: Normal rate and regular rhythm.                 Pulmonary/Chest: Effort normal and breath sounds without rales or wheezing.                Abd:  Soft, NT, ND, + BS, no organomegaly               Neurological: Pt is alert. At baseline orientation, motor grossly intact               Skin: Skin is warm. No rashes, no other new lesions, LE edema - none               Psychiatric: Pt behavior is normal without agitation   Micro: none  Cardiac tracings I have personally interpreted today:  none  Pertinent Radiological findings (summarize): none   Lab Results  Component Value Date   WBC 5.9 05/10/2023   HGB 14.4 05/10/2023   HCT 43.4 05/10/2023   PLT 261.0 05/10/2023   GLUCOSE 88 01/20/2024   CHOL 180 01/20/2024   TRIG 135.0 01/20/2024   HDL 44.50 01/20/2024   LDLCALC 108 (H) 01/20/2024   ALT 21 01/20/2024   AST 18 01/20/2024   NA 138 01/20/2024   K 3.5  01/20/2024   CL 104 01/20/2024   CREATININE 1.04 01/20/2024   BUN 11 01/20/2024   CO2 27 01/20/2024   TSH 0.85 05/10/2023   HGBA1C 5.5 01/20/2024   MICROALBUR <0.7 05/10/2023   Assessment/Plan:  [name] is a 53 y.o. Black or African American [2] female with  has a past medical history of ABDOMINAL TENDERNESS (06/10/2010), ANEMIA-IRON DEFICIENCY (06/10/2010), CHEST PAIN (06/10/2010), HYPERTENSION (06/10/2010), MENORRHALGIA (06/10/2010), and NEPHROLITHIASIS, HX OF (06/10/2010).  Encounter for well adult exam with abnormal findings Age and sex appropriate education and counseling updated with regular exercise and diet Referrals for preventative services - for hep c screen Immunizations addressed - for shingrix at pharmacy Smoking counseling  - none needed Evidence for depression or other mood disorder - none significant Most recent labs reviewed. I have personally reviewed and have noted: 1) the patient's medical and social history 2) The patient's current medications and supplements 3) The patient's height, weight, and BMI have been recorded in the chart   Essential hypertension BP Readings from Last 3 Encounters:  01/24/24 130/84  05/10/23 136/82  08/10/22 138/84   Stable, pt to continue medical treatment azor  5 40 every day, hct 25 qd   HLD (hyperlipidemia) Lab Results  Component Value Date   LDLCALC 108 (H) 01/20/2024   Uncontrolled, for lower chol diet,  pt declines statin   Hyperglycemia Lab Results  Component Value Date   HGBA1C 5.5 01/20/2024   Stable, pt to continue current medical treatment  - diet, wt control   Vitamin D  deficiency Last vitamin D  Lab Results  Component Value Date   VD25OH 11.97 (L) 01/20/2024   Low, to start oral replacement   B12 deficiency Lab  Results  Component Value Date   VITAMINB12 275 01/20/2024   Low, to start oral replacement - b12 1000 mcg qd   Bilateral foot pain Etiology unclear, for ortho referral per pt  request  Followup: Return in about 1 year (around 01/23/2025).  Rosalia Colonel, MD 01/25/2024 8:51 PM [facility]

## 2024-01-24 NOTE — Patient Instructions (Addendum)
 Please have your Shingrix (shingles) shots done at your local pharmacy.  Please take OTC Vitamin D3 at 2000 units per day, indefinitely, or 4000 units if you already take the 2000 units.    Also please start the OTC B12 1000 mcg per day for 6 months.    Please continue all other medications as before, and refills have been done if requested.  Please have the pharmacy call with any other refills you may need.  Please continue your efforts at being more active, low cholesterol diet, and weight control.  You are otherwise up to date with prevention measures today.  Please keep your appointments with your specialists as you may have planned  Please make an Appointment to return for your 1 year visit, or sooner if needed, with Lab testing by Appointment as well, to be done about 3-5 days before at the FIRST FLOOR Lab (so this is for TWO appointments - please see the scheduling desk as you leave)

## 2024-01-25 ENCOUNTER — Encounter: Payer: Self-pay | Admitting: Internal Medicine

## 2024-01-25 DIAGNOSIS — M79671 Pain in right foot: Secondary | ICD-10-CM | POA: Insufficient documentation

## 2024-01-25 NOTE — Assessment & Plan Note (Signed)
 BP Readings from Last 3 Encounters:  01/24/24 130/84  05/10/23 136/82  08/10/22 138/84   Stable, pt to continue medical treatment azor  5 40 every day, hct 25 qd

## 2024-01-25 NOTE — Assessment & Plan Note (Signed)
 Last vitamin D  Lab Results  Component Value Date   VD25OH 11.97 (L) 01/20/2024   Low, to start oral replacement

## 2024-01-25 NOTE — Assessment & Plan Note (Signed)
 Lab Results  Component Value Date   VITAMINB12 275 01/20/2024   Low, to start oral replacement - b12 1000 mcg qd

## 2024-01-25 NOTE — Assessment & Plan Note (Signed)
Age and sex appropriate education and counseling updated with regular exercise and diet Referrals for preventative services - for hep c screen Immunizations addressed - for shingrix at pharmacy Smoking counseling  - none needed Evidence for depression or other mood disorder - none significant Most recent labs reviewed. I have personally reviewed and have noted: 1) the patient's medical and social history 2) The patient's current medications and supplements 3) The patient's height, weight, and BMI have been recorded in the chart

## 2024-01-25 NOTE — Assessment & Plan Note (Signed)
 Etiology unclear, for ortho referral per pt request

## 2024-01-25 NOTE — Assessment & Plan Note (Signed)
 Lab Results  Component Value Date   HGBA1C 5.5 01/20/2024   Stable, pt to continue current medical treatment  - diet, wt control

## 2024-01-25 NOTE — Assessment & Plan Note (Signed)
 Lab Results  Component Value Date   LDLCALC 108 (H) 01/20/2024   Uncontrolled, for lower chol diet,  pt declines statin

## 2024-02-03 DIAGNOSIS — M25572 Pain in left ankle and joints of left foot: Secondary | ICD-10-CM | POA: Diagnosis not present

## 2024-02-03 DIAGNOSIS — M25571 Pain in right ankle and joints of right foot: Secondary | ICD-10-CM | POA: Diagnosis not present

## 2024-02-03 DIAGNOSIS — G8929 Other chronic pain: Secondary | ICD-10-CM | POA: Diagnosis not present

## 2024-02-03 DIAGNOSIS — M6701 Short Achilles tendon (acquired), right ankle: Secondary | ICD-10-CM | POA: Diagnosis not present

## 2024-02-24 DIAGNOSIS — M7661 Achilles tendinitis, right leg: Secondary | ICD-10-CM | POA: Diagnosis not present

## 2024-02-24 DIAGNOSIS — M722 Plantar fascial fibromatosis: Secondary | ICD-10-CM | POA: Diagnosis not present

## 2024-02-24 DIAGNOSIS — M7662 Achilles tendinitis, left leg: Secondary | ICD-10-CM | POA: Diagnosis not present

## 2024-07-20 ENCOUNTER — Telehealth: Payer: Self-pay

## 2024-07-20 MED ORDER — AZITHROMYCIN 250 MG PO TABS: ORAL_TABLET | ORAL | 1 refills | Status: AC

## 2024-07-20 NOTE — Addendum Note (Signed)
 Addended by: NORLEEN LYNWOOD ORN on: 07/20/2024 05:02 PM   Modules accepted: Orders

## 2024-07-20 NOTE — Telephone Encounter (Signed)
 Copied from CRM #[id number]. Topic: Clinical - Medical Advice >> Jul 20, 2024  1:26 PM [name] wrote: Reason for CRM: Patient would like to know if Dr. Norleen will prescribe something for her cold, like a zpack. She can not come in on Monday.    Please LVM, patient is currently working.

## 2024-07-20 NOTE — Telephone Encounter (Signed)
Ok this is done erx 

## 2024-07-23 ENCOUNTER — Ambulatory Visit: Admitting: Family Medicine
